# Patient Record
Sex: Female | Born: 1970 | Race: Black or African American | Hispanic: No | Marital: Married | Smoking: Never smoker
Health system: Southern US, Community
[De-identification: ages and names within clinical notes are randomized; demographics above are authoritative.]

## PROBLEM LIST (undated history)

## (undated) DIAGNOSIS — I10 Essential (primary) hypertension: Secondary | ICD-10-CM

## (undated) DIAGNOSIS — F319 Bipolar disorder, unspecified: Secondary | ICD-10-CM

## (undated) DIAGNOSIS — E119 Type 2 diabetes mellitus without complications: Secondary | ICD-10-CM

## (undated) HISTORY — PX: HYSTEROSCOPY: SHX211

## (undated) HISTORY — PX: OTHER SURGICAL HISTORY: SHX169

---

## 2000-06-11 ENCOUNTER — Other Ambulatory Visit: Admission: RE | Admit: 2000-06-11 | Discharge: 2000-06-11 | Payer: Self-pay | Admitting: Internal Medicine

## 2000-06-12 ENCOUNTER — Encounter: Payer: Self-pay | Admitting: Internal Medicine

## 2001-07-29 ENCOUNTER — Emergency Department (HOSPITAL_COMMUNITY): Admission: EM | Admit: 2001-07-29 | Discharge: 2001-07-29 | Payer: Self-pay | Admitting: Emergency Medicine

## 2002-08-07 ENCOUNTER — Emergency Department (HOSPITAL_COMMUNITY): Admission: EM | Admit: 2002-08-07 | Discharge: 2002-08-07 | Payer: Self-pay | Admitting: Emergency Medicine

## 2003-04-07 ENCOUNTER — Encounter (INDEPENDENT_AMBULATORY_CARE_PROVIDER_SITE_OTHER): Payer: Self-pay | Admitting: Specialist

## 2003-04-07 ENCOUNTER — Ambulatory Visit (HOSPITAL_COMMUNITY): Admission: RE | Admit: 2003-04-07 | Discharge: 2003-04-07 | Payer: Self-pay | Admitting: *Deleted

## 2003-06-16 ENCOUNTER — Inpatient Hospital Stay (HOSPITAL_COMMUNITY): Admission: AD | Admit: 2003-06-16 | Discharge: 2003-06-16 | Payer: Self-pay | Admitting: Obstetrics & Gynecology

## 2003-06-19 ENCOUNTER — Inpatient Hospital Stay (HOSPITAL_COMMUNITY): Admission: AD | Admit: 2003-06-19 | Discharge: 2003-06-19 | Payer: Self-pay | Admitting: *Deleted

## 2003-06-20 ENCOUNTER — Inpatient Hospital Stay (HOSPITAL_COMMUNITY): Admission: AD | Admit: 2003-06-20 | Discharge: 2003-06-20 | Payer: Self-pay | Admitting: Obstetrics & Gynecology

## 2003-06-30 ENCOUNTER — Ambulatory Visit (HOSPITAL_COMMUNITY): Admission: RE | Admit: 2003-06-30 | Discharge: 2003-06-30 | Payer: Self-pay | Admitting: Obstetrics and Gynecology

## 2003-07-17 ENCOUNTER — Inpatient Hospital Stay (HOSPITAL_COMMUNITY): Admission: AD | Admit: 2003-07-17 | Discharge: 2003-07-17 | Payer: Self-pay | Admitting: Obstetrics & Gynecology

## 2003-07-22 ENCOUNTER — Encounter (INDEPENDENT_AMBULATORY_CARE_PROVIDER_SITE_OTHER): Payer: Self-pay | Admitting: Specialist

## 2003-07-22 ENCOUNTER — Inpatient Hospital Stay (HOSPITAL_COMMUNITY): Admission: AD | Admit: 2003-07-22 | Discharge: 2003-07-22 | Payer: Self-pay | Admitting: Obstetrics and Gynecology

## 2004-04-17 ENCOUNTER — Ambulatory Visit: Payer: Self-pay | Admitting: Nurse Practitioner

## 2004-04-29 ENCOUNTER — Ambulatory Visit (HOSPITAL_COMMUNITY): Admission: RE | Admit: 2004-04-29 | Discharge: 2004-04-29 | Payer: Self-pay | Admitting: Internal Medicine

## 2004-07-15 ENCOUNTER — Encounter: Admission: RE | Admit: 2004-07-15 | Discharge: 2004-07-15 | Payer: Self-pay | Admitting: Obstetrics and Gynecology

## 2004-07-16 ENCOUNTER — Encounter (INDEPENDENT_AMBULATORY_CARE_PROVIDER_SITE_OTHER): Payer: Self-pay | Admitting: *Deleted

## 2004-07-16 ENCOUNTER — Ambulatory Visit (HOSPITAL_COMMUNITY): Admission: RE | Admit: 2004-07-16 | Discharge: 2004-07-16 | Payer: Self-pay | Admitting: Obstetrics and Gynecology

## 2004-07-18 ENCOUNTER — Inpatient Hospital Stay (HOSPITAL_COMMUNITY): Admission: AD | Admit: 2004-07-18 | Discharge: 2004-07-18 | Payer: Self-pay | Admitting: Obstetrics and Gynecology

## 2004-10-05 IMAGING — US US OB TRANSVAGINAL
1 series · 18 of 18 positions shown · non-contrast
Comparison: none

CLINICAL DATA: Early pregnancy.
TRANSVAGINAL OBSTETRICAL ULTRASOUND:
Comparing 06/30/03.
We demonstrate an intrauterine sac with mean sac diameter of 1.9 cm consistent with a 6 week 6 day pregnancy.  However, the patient?s gestational age by her first ultrasound should be 8 weeks 1 day and whereas on that prior study of 06/30/03 we demonstrated a normal appearing yolk sac, currently we demonstrate only an irregular appearing membrane and no definite fetal pole.  No fetal cardiac activity is identified.  The uterus is retroverted.  Neither ovary could be well-seen.  No free pelvic fluid is identified.
IMPRESSION
Failure of expected development of embryonic pole.  The appearance is most consistent with anembryonic pregnancy.

[Series 1: us ob transvaginal · 18 of 18 slices shown]
[im 1/18]
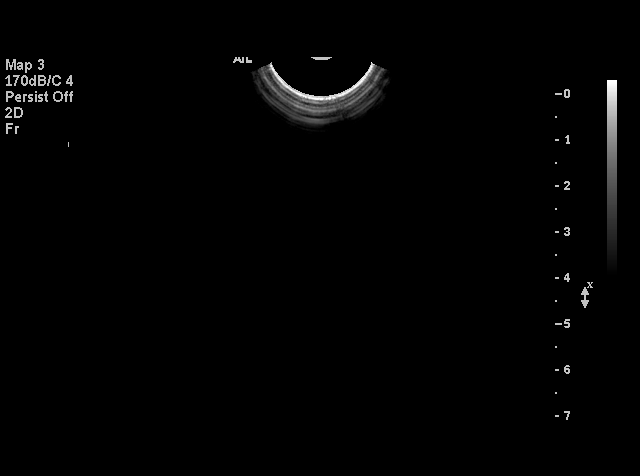
[im 2/18]
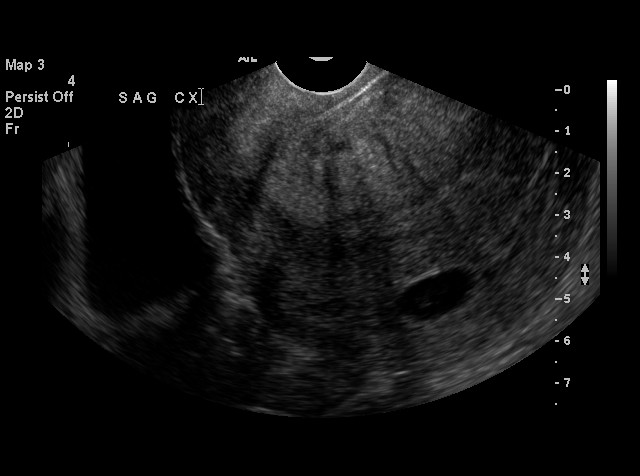
[im 3/18]
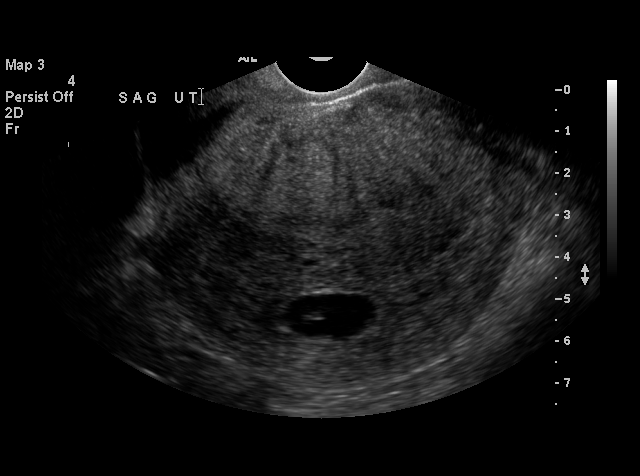
[im 4/18]
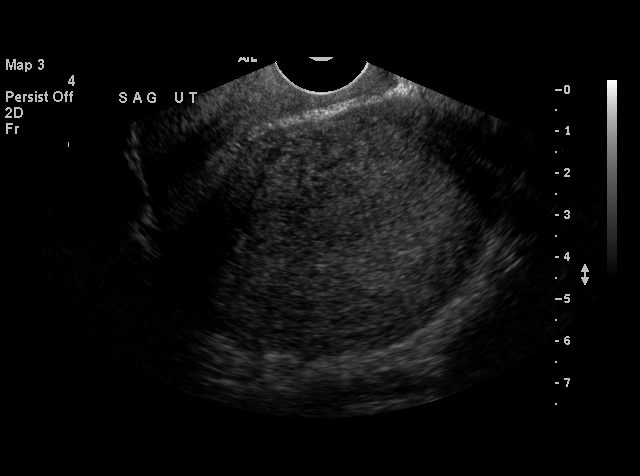
[im 5/18]
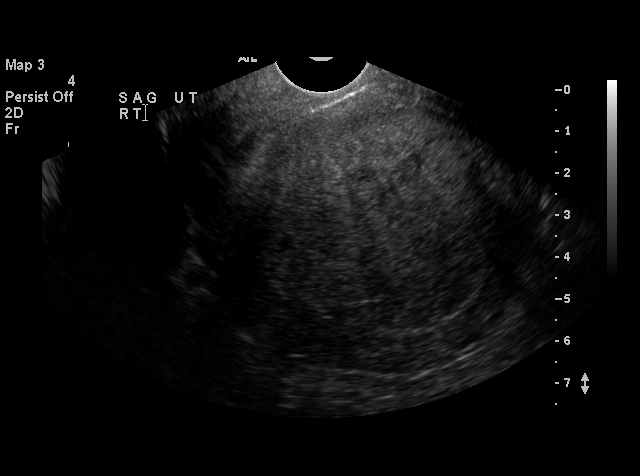
[im 6/18]
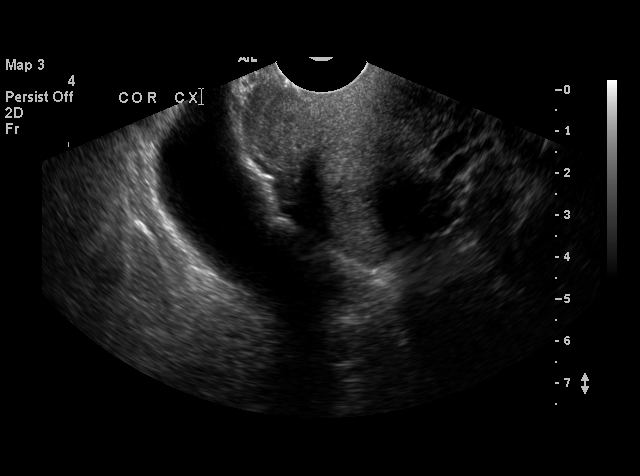
[im 7/18]
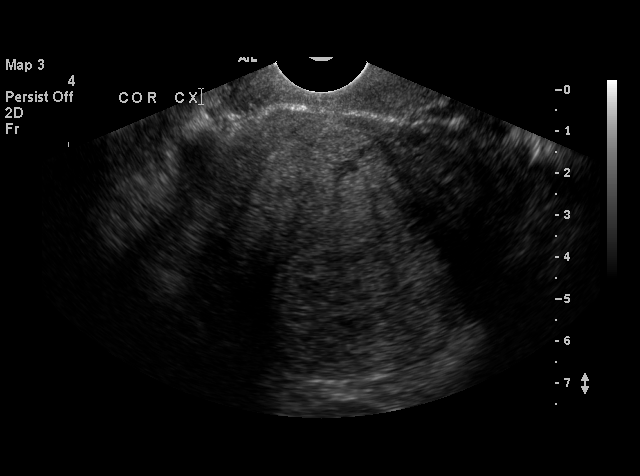
[im 8/18]
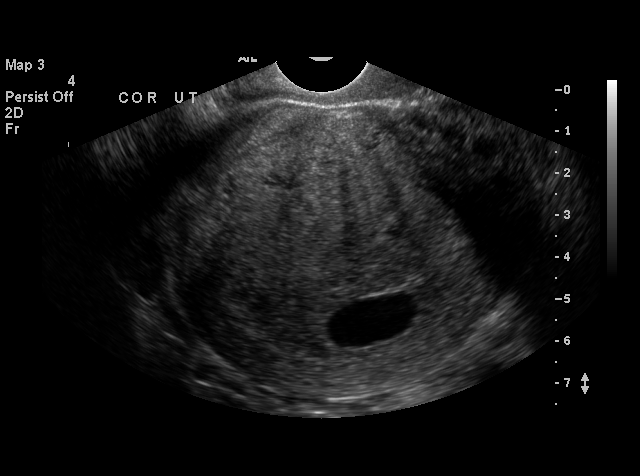
[im 9/18]
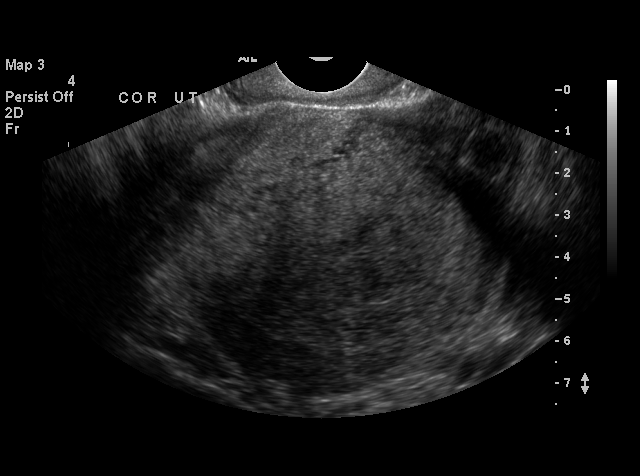
[im 10/18]
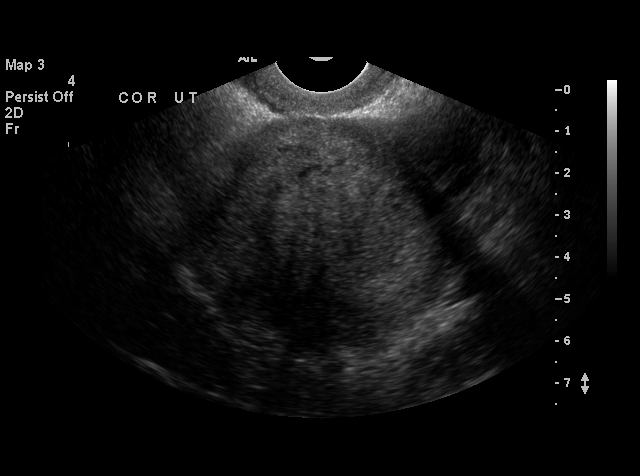
[im 11/18]
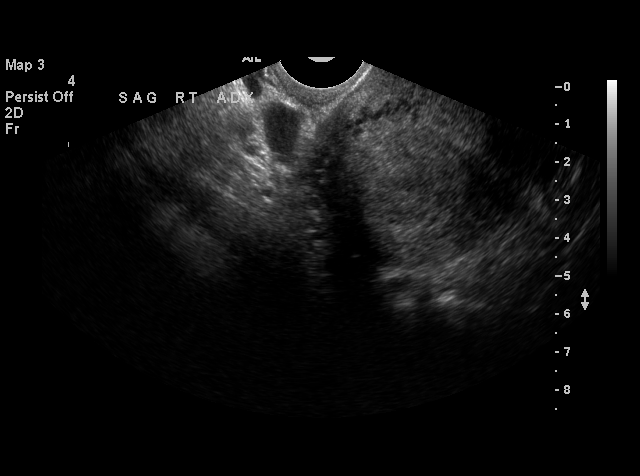
[im 12/18]
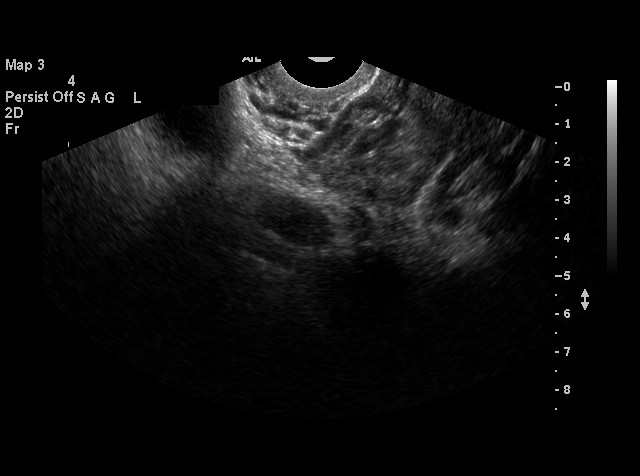
[im 13/18]
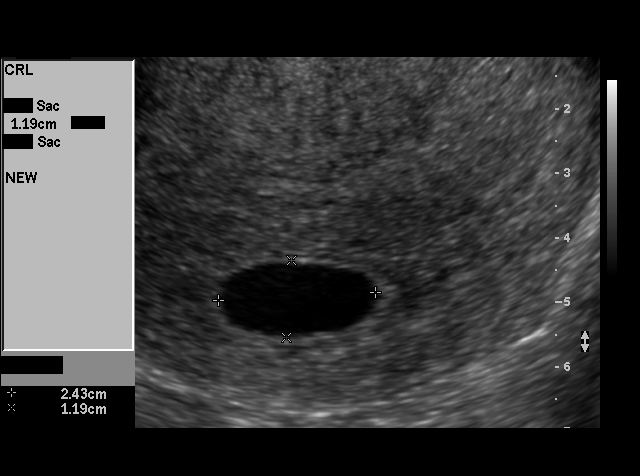
[im 14/18]
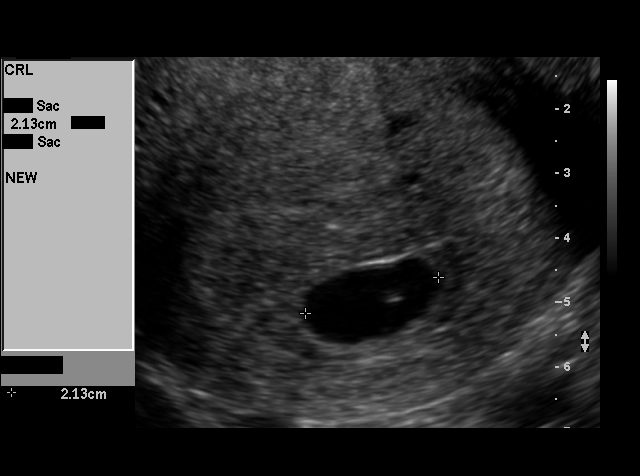
[im 15/18]
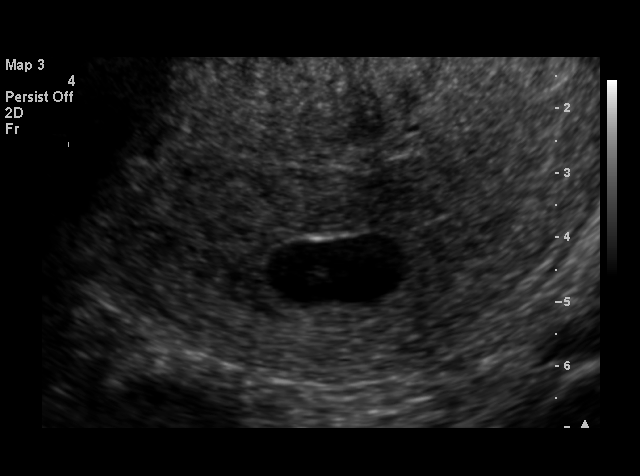
[im 16/18]
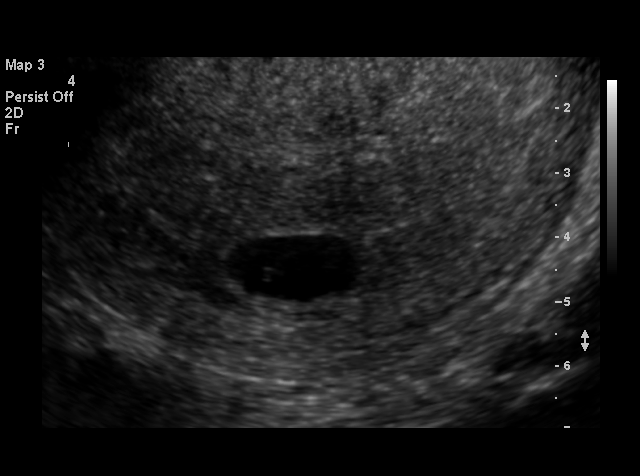
[im 17/18]
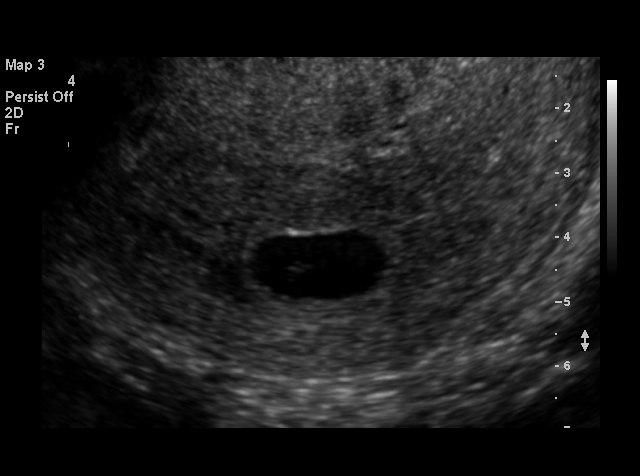
[im 18/18]
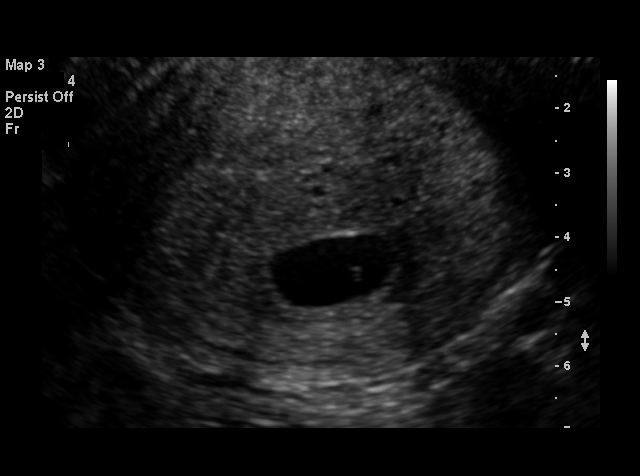

[18 of 18 positions shown; findings below may reference images not displayed]

## 2005-03-31 ENCOUNTER — Ambulatory Visit (HOSPITAL_COMMUNITY): Admission: RE | Admit: 2005-03-31 | Discharge: 2005-03-31 | Payer: Self-pay | Admitting: Obstetrics & Gynecology

## 2005-05-03 ENCOUNTER — Inpatient Hospital Stay (HOSPITAL_COMMUNITY): Admission: AD | Admit: 2005-05-03 | Discharge: 2005-05-03 | Payer: Self-pay | Admitting: Obstetrics

## 2005-12-16 ENCOUNTER — Ambulatory Visit (HOSPITAL_COMMUNITY): Admission: RE | Admit: 2005-12-16 | Discharge: 2005-12-16 | Payer: Self-pay | Admitting: Obstetrics & Gynecology

## 2006-01-16 ENCOUNTER — Ambulatory Visit (HOSPITAL_COMMUNITY): Admission: RE | Admit: 2006-01-16 | Discharge: 2006-01-16 | Payer: Self-pay | Admitting: Obstetrics & Gynecology

## 2006-03-18 ENCOUNTER — Inpatient Hospital Stay (HOSPITAL_COMMUNITY): Admission: AD | Admit: 2006-03-18 | Discharge: 2006-03-18 | Payer: Self-pay | Admitting: Obstetrics

## 2006-04-01 ENCOUNTER — Inpatient Hospital Stay (HOSPITAL_COMMUNITY): Admission: AD | Admit: 2006-04-01 | Discharge: 2006-04-01 | Payer: Self-pay | Admitting: Obstetrics

## 2008-04-13 ENCOUNTER — Emergency Department (HOSPITAL_COMMUNITY): Admission: AD | Admit: 2008-04-13 | Discharge: 2008-04-13 | Payer: Self-pay | Admitting: Family Medicine

## 2008-05-09 ENCOUNTER — Inpatient Hospital Stay (HOSPITAL_COMMUNITY): Admission: AD | Admit: 2008-05-09 | Discharge: 2008-05-09 | Payer: Self-pay | Admitting: Obstetrics

## 2008-08-16 ENCOUNTER — Ambulatory Visit (HOSPITAL_COMMUNITY): Admission: RE | Admit: 2008-08-16 | Discharge: 2008-08-16 | Payer: Self-pay | Admitting: Obstetrics & Gynecology

## 2008-08-18 ENCOUNTER — Observation Stay: Payer: Self-pay

## 2008-08-25 ENCOUNTER — Ambulatory Visit (HOSPITAL_COMMUNITY): Admission: RE | Admit: 2008-08-25 | Discharge: 2008-08-25 | Payer: Self-pay | Admitting: Obstetrics & Gynecology

## 2008-09-21 ENCOUNTER — Encounter: Admission: RE | Admit: 2008-09-21 | Discharge: 2008-10-03 | Payer: Self-pay | Admitting: Obstetrics & Gynecology

## 2008-09-29 ENCOUNTER — Ambulatory Visit (HOSPITAL_COMMUNITY): Admission: RE | Admit: 2008-09-29 | Discharge: 2008-09-29 | Payer: Self-pay | Admitting: Obstetrics & Gynecology

## 2008-10-20 ENCOUNTER — Observation Stay: Payer: Self-pay | Admitting: Obstetrics and Gynecology

## 2008-10-24 ENCOUNTER — Encounter: Admission: RE | Admit: 2008-10-24 | Discharge: 2009-01-03 | Payer: Self-pay | Admitting: Obstetrics & Gynecology

## 2008-10-27 ENCOUNTER — Ambulatory Visit (HOSPITAL_COMMUNITY): Admission: RE | Admit: 2008-10-27 | Discharge: 2008-10-27 | Payer: Self-pay | Admitting: Obstetrics & Gynecology

## 2008-11-15 ENCOUNTER — Ambulatory Visit (HOSPITAL_COMMUNITY): Admission: RE | Admit: 2008-11-15 | Discharge: 2008-11-15 | Payer: Self-pay | Admitting: Obstetrics & Gynecology

## 2008-11-25 ENCOUNTER — Inpatient Hospital Stay (HOSPITAL_COMMUNITY): Admission: AD | Admit: 2008-11-25 | Discharge: 2008-11-26 | Payer: Self-pay | Admitting: Obstetrics & Gynecology

## 2008-12-06 ENCOUNTER — Ambulatory Visit (HOSPITAL_COMMUNITY): Admission: RE | Admit: 2008-12-06 | Discharge: 2008-12-06 | Payer: Self-pay | Admitting: Obstetrics & Gynecology

## 2008-12-14 ENCOUNTER — Observation Stay: Payer: Self-pay | Admitting: Obstetrics and Gynecology

## 2008-12-18 ENCOUNTER — Inpatient Hospital Stay (HOSPITAL_COMMUNITY): Admission: AD | Admit: 2008-12-18 | Discharge: 2008-12-18 | Payer: Self-pay | Admitting: Obstetrics

## 2008-12-20 ENCOUNTER — Inpatient Hospital Stay (HOSPITAL_COMMUNITY): Admission: AD | Admit: 2008-12-20 | Discharge: 2008-12-20 | Payer: Self-pay | Admitting: Obstetrics & Gynecology

## 2008-12-22 ENCOUNTER — Inpatient Hospital Stay (HOSPITAL_COMMUNITY): Admission: AD | Admit: 2008-12-22 | Discharge: 2008-12-25 | Payer: Self-pay | Admitting: Obstetrics

## 2008-12-25 ENCOUNTER — Encounter: Payer: Self-pay | Admitting: Obstetrics & Gynecology

## 2009-01-02 ENCOUNTER — Ambulatory Visit (HOSPITAL_COMMUNITY): Admission: RE | Admit: 2009-01-02 | Discharge: 2009-01-02 | Payer: Self-pay | Admitting: Obstetrics & Gynecology

## 2009-01-08 ENCOUNTER — Inpatient Hospital Stay (HOSPITAL_COMMUNITY): Admission: AD | Admit: 2009-01-08 | Discharge: 2009-01-11 | Payer: Self-pay | Admitting: Obstetrics

## 2010-01-27 ENCOUNTER — Encounter: Payer: Self-pay | Admitting: Obstetrics

## 2010-01-27 ENCOUNTER — Encounter: Payer: Self-pay | Admitting: Obstetrics & Gynecology

## 2010-01-27 ENCOUNTER — Encounter: Payer: Self-pay | Admitting: Obstetrics and Gynecology

## 2010-01-28 ENCOUNTER — Encounter: Payer: Self-pay | Admitting: Obstetrics & Gynecology

## 2010-01-31 ENCOUNTER — Other Ambulatory Visit: Payer: Self-pay | Admitting: Internal Medicine

## 2010-01-31 DIAGNOSIS — Z1231 Encounter for screening mammogram for malignant neoplasm of breast: Secondary | ICD-10-CM

## 2010-01-31 DIAGNOSIS — Z1239 Encounter for other screening for malignant neoplasm of breast: Secondary | ICD-10-CM

## 2010-02-15 ENCOUNTER — Ambulatory Visit: Payer: Self-pay

## 2010-03-24 LAB — COMPREHENSIVE METABOLIC PANEL
ALT: 18 U/L (ref 0–35)
Alkaline Phosphatase: 106 U/L (ref 39–117)
BUN: 5 mg/dL — ABNORMAL LOW (ref 6–23)
CO2: 22 mEq/L (ref 19–32)
Chloride: 106 mEq/L (ref 96–112)
GFR calc non Af Amer: >60 mL/min (ref >60)
Glucose, Bld: 89 mg/dL (ref 70–99)
Potassium: 3.7 mEq/L (ref 3.5–5.1)
Sodium: 133 mEq/L — ABNORMAL LOW (ref 135–145)
Total Bilirubin: 0.6 mg/dL (ref 0.3–1.2)
Total Protein: 5.7 g/dL — ABNORMAL LOW (ref 6.0–8.3)

## 2010-03-24 LAB — URIC ACID: Uric Acid, Serum: 3.9 mg/dL (ref 2.4–7.0)

## 2010-03-24 LAB — RPR: RPR Ser Ql: NONREACTIVE

## 2010-03-24 LAB — CBC
HCT: 35.3 % — ABNORMAL LOW (ref 36.0–46.0)
HCT: 39.6 % (ref 36.0–46.0)
Hemoglobin: 13.3 g/dL (ref 12.0–15.0)
Platelets: 169 10*3/uL (ref 150–400)
RBC: 4.45 MIL/uL (ref 3.87–5.11)
RDW: 14 % (ref 11.5–15.5)
RDW: 14.1 % (ref 11.5–15.5)

## 2010-03-24 LAB — GLUCOSE, CAPILLARY
Glucose-Capillary: 103 mg/dL — ABNORMAL HIGH (ref 70–99)
Glucose-Capillary: 120 mg/dL — ABNORMAL HIGH (ref 70–99)
Glucose-Capillary: 66 mg/dL — ABNORMAL LOW (ref 70–99)
Glucose-Capillary: 88 mg/dL (ref 70–99)

## 2010-03-24 LAB — LACTATE DEHYDROGENASE: LDH: 134 U/L (ref 94–250)

## 2010-03-24 IMAGING — US US OB LIMITED
1 series · 18 of 21 positions shown · non-contrast
Comparison: none

OBSTETRICAL ULTRASOUND:
 This ultrasound was performed in The [HOSPITAL], and the [facility] report will be stored to [REDACTED] PACS.  This report is also available in [HOSPITAL]?s accessANYware.

[Series 1: us ob limited · 21 acquisitions, 18 frames shown]
[im 1/21]
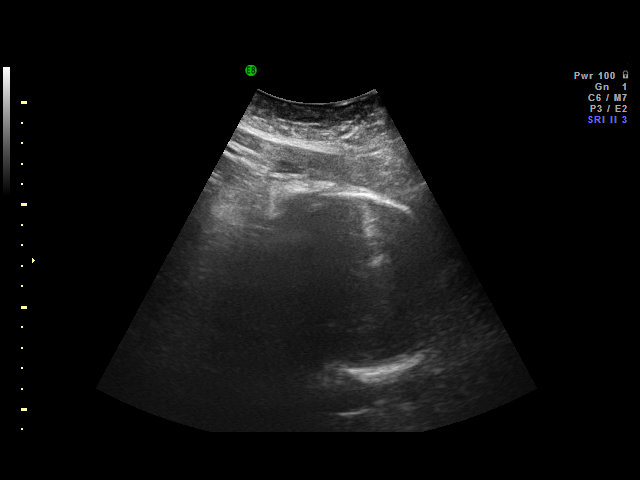
[im 2/21]
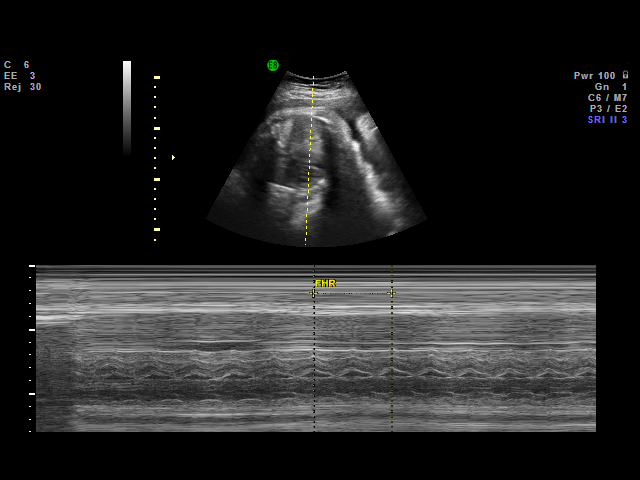
[im 3/21]
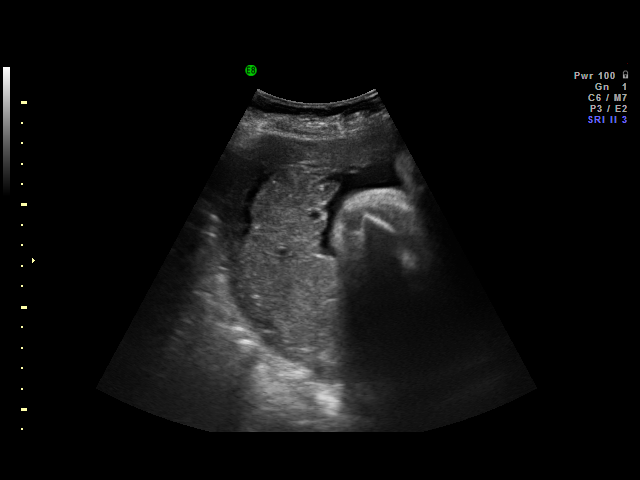
[im 5/21]
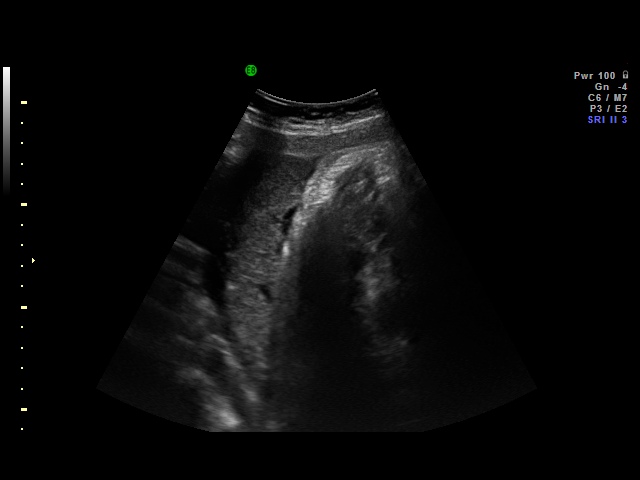
[im 6/21]
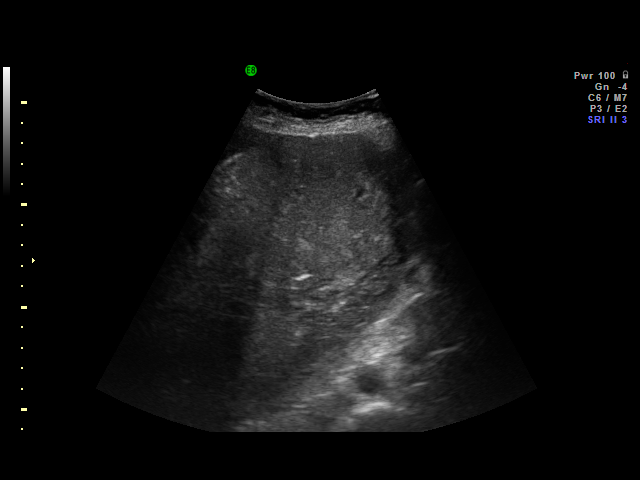
[im 7/21]
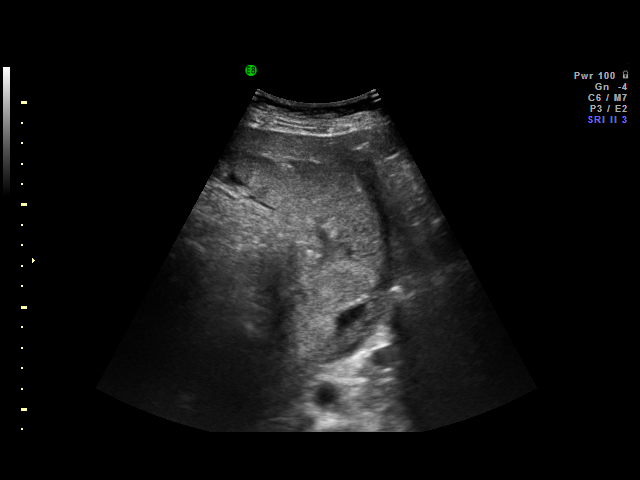
[im 8/21]
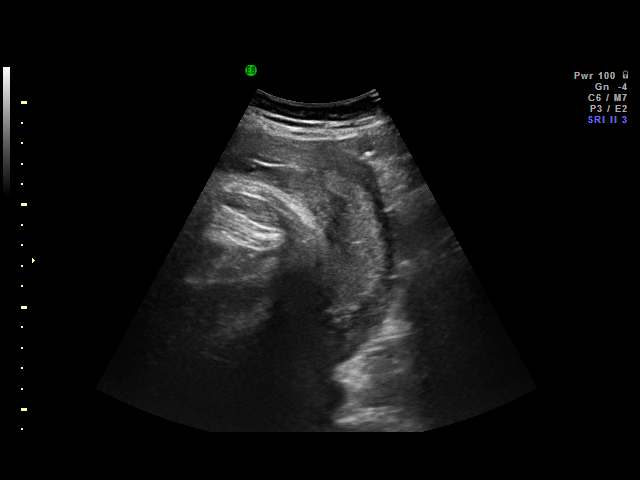
[im 9/21]
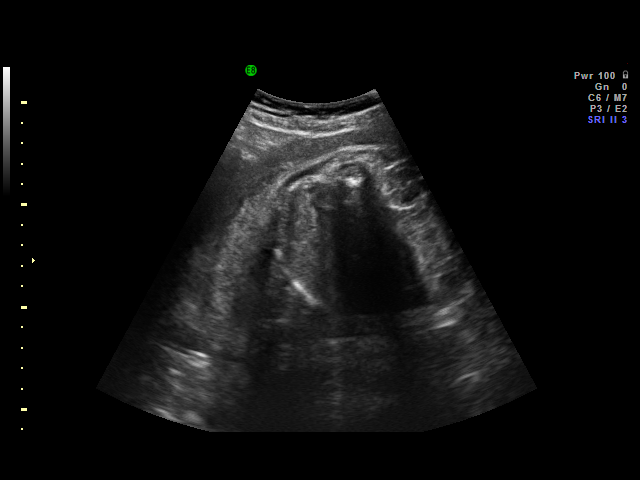
[im 10/21]
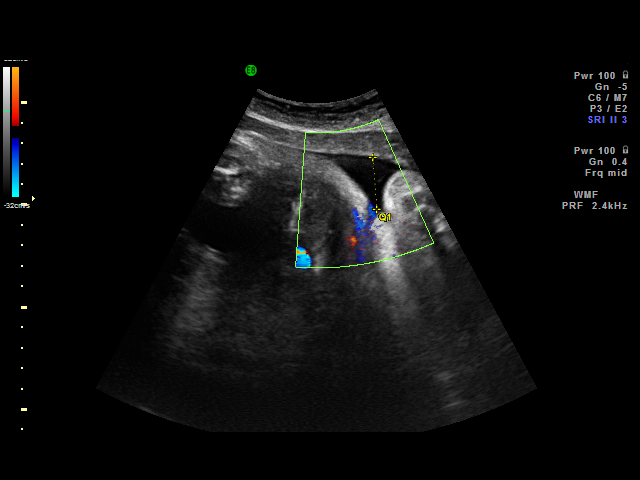
[im 12/21]
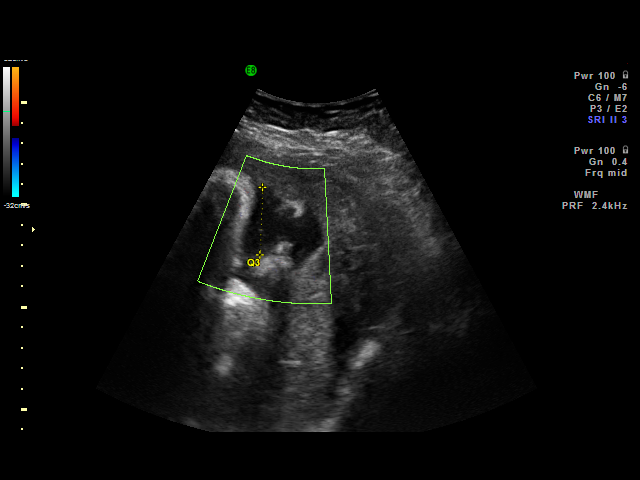
[im 13/21]
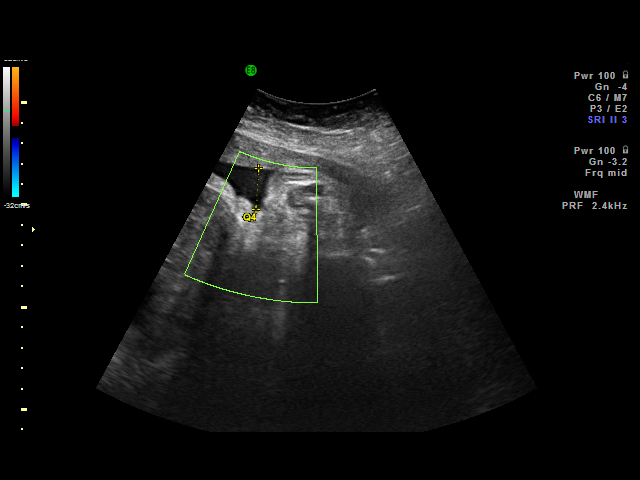
[im 14/21]
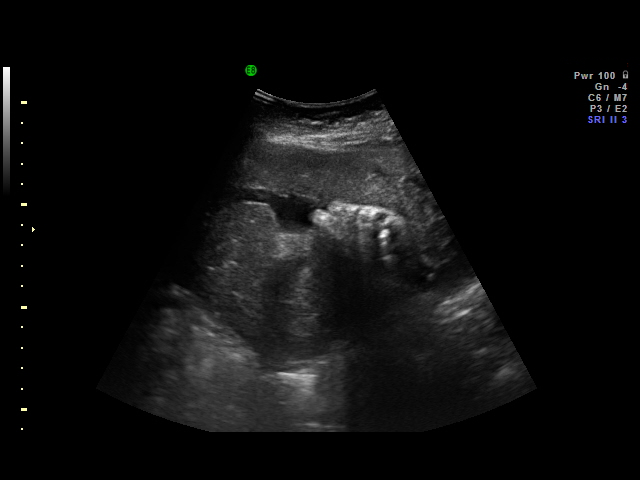
[im 15/21]
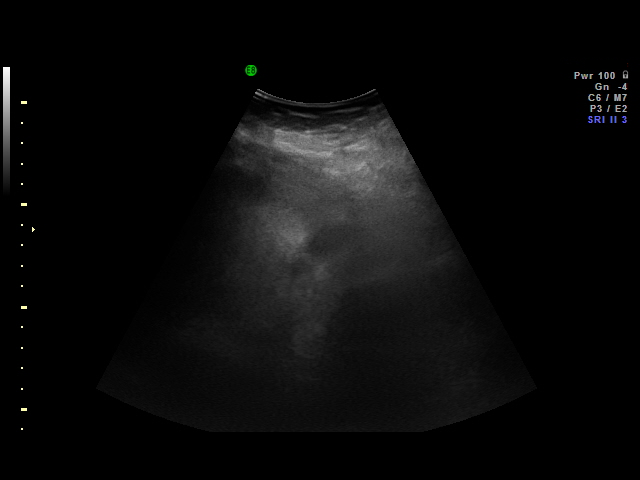
[im 16/21]
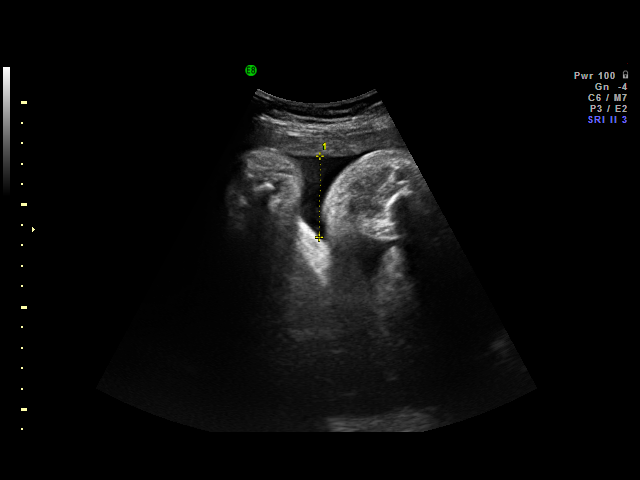
[im 17/21]
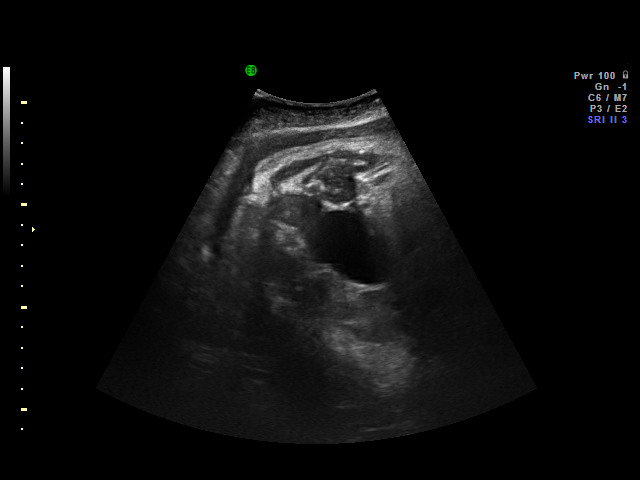
[im 19/21]
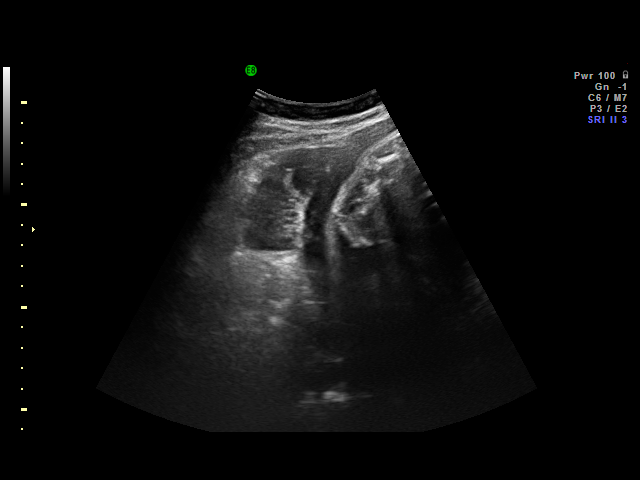
[im 20/21]
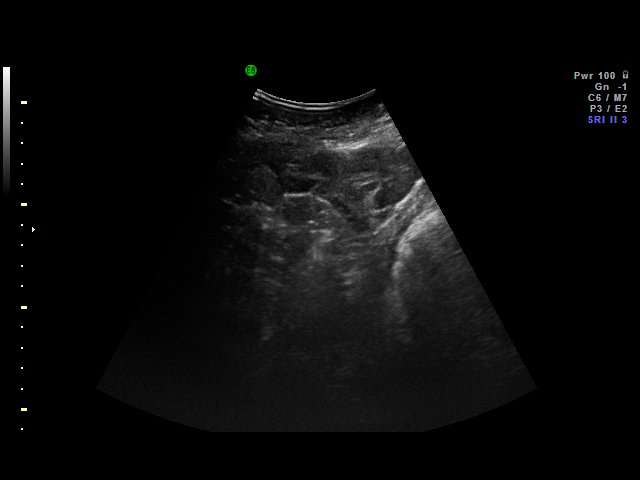
[im 21/21]
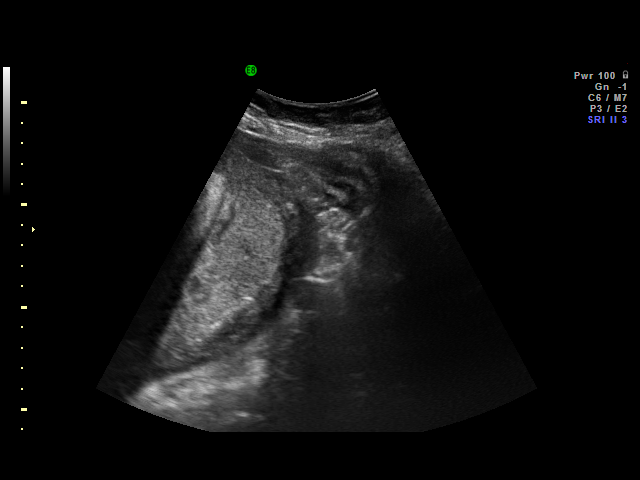

[18 of 21 positions shown; findings below may reference images not displayed]

IMPRESSION: [facility] has also been faxed to the ordering physician.

## 2010-04-08 LAB — CBC
HCT: 39 % (ref 36.0–46.0)
MCV: 88.1 fL (ref 78.0–100.0)
Platelets: 264 10*3/uL (ref 150–400)
RDW: 14.3 % (ref 11.5–15.5)

## 2010-04-08 LAB — URINALYSIS, DIPSTICK ONLY
Bilirubin Urine: NEGATIVE
Glucose, UA: NEGATIVE mg/dL
Hgb urine dipstick: NEGATIVE
Ketones, ur: NEGATIVE mg/dL
pH: 6.5 (ref 5.0–8.0)

## 2010-04-08 LAB — COMPREHENSIVE METABOLIC PANEL
Albumin: 2.4 g/dL — ABNORMAL LOW (ref 3.5–5.2)
BUN: 6 mg/dL (ref 6–23)
Creatinine, Ser: 0.85 mg/dL (ref 0.4–1.2)
Potassium: 3.8 mEq/L (ref 3.5–5.1)
Total Protein: 6 g/dL (ref 6.0–8.3)

## 2010-04-08 LAB — GLUCOSE, CAPILLARY
Glucose-Capillary: 106 mg/dL — ABNORMAL HIGH (ref 70–99)
Glucose-Capillary: 108 mg/dL — ABNORMAL HIGH (ref 70–99)
Glucose-Capillary: 122 mg/dL — ABNORMAL HIGH (ref 70–99)
Glucose-Capillary: 137 mg/dL — ABNORMAL HIGH (ref 70–99)
Glucose-Capillary: 78 mg/dL (ref 70–99)
Glucose-Capillary: 88 mg/dL (ref 70–99)
Glucose-Capillary: 95 mg/dL (ref 70–99)

## 2010-04-08 LAB — URINE CULTURE: Colony Count: 25000

## 2010-04-09 LAB — CBC
HCT: 39.8 % (ref 36.0–46.0)
Hemoglobin: 13.2 g/dL (ref 12.0–15.0)
Platelets: 257 10*3/uL (ref 150–400)
Platelets: 261 10*3/uL (ref 150–400)
RDW: 14.3 % (ref 11.5–15.5)
WBC: 8 10*3/uL (ref 4.0–10.5)
WBC: 8.2 10*3/uL (ref 4.0–10.5)

## 2010-04-09 LAB — URINALYSIS, ROUTINE W REFLEX MICROSCOPIC
Bilirubin Urine: NEGATIVE
Glucose, UA: >1000 mg/dL — AB
Ketones, ur: NEGATIVE mg/dL
Specific Gravity, Urine: 1.015 (ref 1.005–1.030)
pH: 5.5 (ref 5.0–8.0)

## 2010-04-09 LAB — COMPREHENSIVE METABOLIC PANEL
ALT: 19 U/L (ref 0–35)
AST: 21 U/L (ref 0–37)
Albumin: 2.4 g/dL — ABNORMAL LOW (ref 3.5–5.2)
Albumin: 2.4 g/dL — ABNORMAL LOW (ref 3.5–5.2)
Alkaline Phosphatase: 100 U/L (ref 39–117)
BUN: 3 mg/dL — ABNORMAL LOW (ref 6–23)
Chloride: 102 mEq/L (ref 96–112)
Chloride: 106 mEq/L (ref 96–112)
Creatinine, Ser: 0.72 mg/dL (ref 0.4–1.2)
GFR calc Af Amer: >60 mL/min (ref >60)
GFR calc non Af Amer: >60 mL/min (ref >60)
Potassium: 3.6 mEq/L (ref 3.5–5.1)
Sodium: 131 mEq/L — ABNORMAL LOW (ref 135–145)
Total Bilirubin: 0.5 mg/dL (ref 0.3–1.2)
Total Bilirubin: 0.7 mg/dL (ref 0.3–1.2)

## 2010-04-09 LAB — LACTATE DEHYDROGENASE: LDH: 111 U/L (ref 94–250)

## 2010-04-09 LAB — URINE MICROSCOPIC-ADD ON

## 2010-04-09 LAB — URIC ACID: Uric Acid, Serum: 3.7 mg/dL (ref 2.4–7.0)

## 2010-04-10 LAB — URINALYSIS, ROUTINE W REFLEX MICROSCOPIC
Glucose, UA: 100 mg/dL — AB
Nitrite: NEGATIVE
Protein, ur: 100 mg/dL — AB
pH: 5.5 (ref 5.0–8.0)

## 2010-04-10 LAB — URINE MICROSCOPIC-ADD ON

## 2010-05-24 NOTE — H&P (Signed)
NAME:  Emily, Navarro                       ACCOUNT NO.:  [id number]   MEDICAL RECORD NO.:  [id number]                   PATIENT TYPE:  AMB   LOCATION:  [facility]C                                  FACILITY:  [facility]   PHYSICIAN:  [place] B. Earlene Plater, M.D.               DATE OF BIRTH:  June 15, 1970   DATE OF ADMISSION:  DATE OF DISCHARGE:                                HISTORY & PHYSICAL   DATE OF PROCEDURE:  April 07, 2003   PREOPERATIVE DIAGNOSIS:  Abnormal bleeding and markedly-thickened  endometrium with unsatisfactory sonohysterogram preoperatively.   HISTORY OF PRESENT ILLNESS:  A 40 year old African-American female gravida 5  para 2-0-3-2 with a history of irregular and heavy bleeding.  Has been  controlled with Lo/Ovral taper but this is causing severe nausea.  Ultrasound in the office showed a very thickened endometrium (2.6 cm) with a  focal area suggestive of a polyp.  Saline infusion ultrasound was  subsequently performed but adequate uterine distention could not be obtained  to further delineate the nature of the lesion.  The patient therefore  presents for hysteroscopy for surgical diagnosis and potential treatment.   PAST MEDICAL HISTORY:  None.   PAST OBSTETRICAL HISTORY:  SVD x2, TAB x2, SAB x1.   SURGICAL HISTORY:  D&C x2.   MEDICATIONS:  None.   ALLERGIES:  None.   SOCIAL HISTORY:  Occasional alcohol, no tobacco or other drugs.   FAMILY HISTORY:  Noncontributory.   REVIEW OF SYMPTOMS:  Otherwise noncontributory.   PHYSICAL EXAMINATION:  VITAL SIGNS:  Blood pressure 124/84, temperature  98.4, weight 198.  GENERAL:  Alert and oriented, no acute distress.  SKIN:  Warm and dry, no lesions.  HEART:  Regular rate and rhythm.  LUNGS:  Clear to auscultation.  ABDOMEN:  Obese, no masses palpable.  PELVIC:  Normal external genitalia, vagina and cervix normal.  Uterus normal  size, retroverted, nontender.   ASSESSMENT:  Abnormal bleeding, very thickened endometrium on  ultrasound  with unsatisfactory saline infusion ultrasound.  Discussed with the patient  potential diagnoses such as endometrial hyperplasia, proliferation, or  endometrial polyps as potential causes of her bleeding.  Operative risks  discussed including infection, bleeding, uterine perforation, damage to  surrounding organs, or fluid overload.  All questions answered.  The patient  wishes to proceed.                                               Gerri Spore B. Earlene Plater, M.D.    WBD/MEDQ  D:  04/06/2003  T:  04/06/2003  Job:  [id number]

## 2010-05-24 NOTE — Op Note (Signed)
Emily Navarro, Emily Navarro             ACCOUNT NO.:  [id number]   MEDICAL RECORD NO.:  [id number]          PATIENT TYPE:  AMB   LOCATION:  SDC                           [facility]ACILITY:  [facility]   PHYSICIAN:  Maxie Better, M.D.DATE OF BIRTH:  January 20, 1970   DATE OF PROCEDURE:  07/16/2004  DATE OF DISCHARGE:                                 OPERATIVE REPORT   PREOPERATIVE DIAGNOSIS:  Abnormal uterine bleeding, endometrial mass.   PROCEDURE:  Diagnostic hysteroscopy, resection of endometrial polyps,  dilatation and curettage.   POSTOPERATIVE DIAGNOSIS:  Abnormal uterine bleeding, endometrial polyp.   ANESTHESIA:  General paracervical block.   SURGEON:  Maxie Better, M.D.   DESCRIPTION OF PROCEDURE:  Under adequate general anesthesia, the patient  was placed in the dorsal lithotomy.  She was sterilely prepped and draped in  the usual fashion.  Bladder was catheterized with a small amount of urine.  Examination under anesthesia revealed a retroverted, bulky uterus, and  parous cervix.  A bivalve speculum was placed in the vagina.  Single tooth  tenaculum was placed on the anterior lip of the cervix.  10 mL of 1%  Nesacaine was injected paracervically at 3 and 9 o'clock.  The cervix was  then serially dilated up to a #31 Pratt dilator.  A resectoscope was  introduced into the uterine cavity.  Copious amount of polypoid tissues were  noted, particularly polypoid mass on the posterior aspect of the endometrial  cavity.  This limited the ability to see the tubal ostia.  The resectoscope  was therefore removed.  The cavity was then curetted for a large amount of  tissue.  The resectoscope was then once again reinserted into the  endometrial cavity.  Other polypoid lesions were then carefully resected.  When the cavity was felt to have been without any polypoid tissue, the  resectoscope was removed.  The endocervical canal had no lesions.  The tubal  ostia were not seen.  The cavity was then  gently curetted for additional  tissue and then all instruments were then removed from the vagina.  Specimen  labled endometrial curetting and endometrial polyps sent to pathology.  Estimated blood loss was minimal.  Fluid deficit was 100 mL.  Complications  were none.  The patient tolerated the procedure well and was transferred to  the recovery room in stable condition.       [place]/MEDQ  D:  07/16/2004  T:  07/16/2004  Job:  [id number]

## 2010-05-24 NOTE — Op Note (Signed)
NAME:  Emily Navarro, Emily Navarro             ACCOUNT NO.:  [id number]   MEDICAL RECORD NO.:  [id number]          PATIENT TYPE:  AMB   LOCATION:  [facility]C                           [facility]ACILITY:  [facility]   PHYSICIAN:  Roseanna Rainbow, M.D.DATE OF BIRTH:  07/13/1970   DATE OF PROCEDURE:  01/16/2006  DATE OF DISCHARGE:                               OPERATIVE REPORT   PREOPERATIVE DIAGNOSIS:  Rule out imbedded intrauterine device.   POSTOPERATIVE DIAGNOSIS:  Rule out imbedded intrauterine device.   PROCEDURE:  Operative hysteroscopy with removal of intrauterine device.   SURGEON:  Jackson-Moore.   ANESTHESIA:  Laryngeal mask airway.   ESTIMATED BLOOD LOSS:  Minimal.   COMPLICATIONS:  None.   PATHOLOGY:  None.   PROCEDURE:  The patient was taken to the operating room with an IV  running.  The laryngeal mask airway was placed without difficulty.  She  was placed in the dorsal lithotomy position and prepped and draped in  the usual sterile fashion.  A weighted speculum was placed in the  patient's vagina.  The anterior lip of the cervix was grasped with a  single-tooth tenaculum.  The cervix was then dilated with [facility]  dilators.  The hysteroscope was then introduced into the cervix and  advanced to the lower uterine segment where the IUD was noted.  This was  grasped under direct visualization with a grasper and removed intact.  A  hysteroscopic survey of the intrauterine cavity was normal.  The  hysteroscope was removed.  The single-toothed tenaculum was removed from  the cervix with minimal bleeding noted.  At the close of the procedure,  the instrument and pack counts were said to be correct x2.  The patient  was taken to the PACU awake and in stable condition.      Roseanna Rainbow, M.D.  Electronically Signed     LAJ/MEDQ  D:  01/16/2006  T:  01/16/2006  Job:  [id number]

## 2010-05-24 NOTE — Op Note (Signed)
NAME:  Emily Navarro, Emily Navarro                       ACCOUNT NO.:  [id number]   MEDICAL RECORD NO.:  [id number]                   PATIENT TYPE:  AMB   LOCATION:  [facility]C                                  [facility]ILITY:  [facility]   PHYSICIAN:  [place] B. Earlene Plater, M.D.               DATE OF BIRTH:  1970/01/10   DATE OF PROCEDURE:  04/07/2003  DATE OF DISCHARGE:                                 OPERATIVE REPORT   PREOPERATIVE DIAGNOSES:  1. Abnormal bleeding.  2. Thickened endometrium.   POSTOPERATIVE DIAGNOSES:  1. Abnormal bleeding.  2. Thickened endometrium.   PROCEDURE:  Hysteroscopy, dilatation and curettage.   SURGEON:  Chester Holstein. Earlene Plater, M.D.   ANESTHESIA:  LMA general.   FINDINGS:  Shaggy, thickened endometrium.  No polyps or masses.   SPECIMENS:  Endometrial curettings.   ESTIMATED BLOOD LOSS:  50 mL.   DEFICIT:  60 mL sorbitol.   COMPLICATIONS:  None.   INDICATIONS:  Patient with a history of heavy and irregular bleeding.  Pelvic ultrasound showed a markedly thickened endometrium (greater than 2  cm).  Saline infusion ultrasound was attempted in the office but could not  obtain adequate uterine distention to further visualize the cavity;  therefore, the patient presents for hysteroscopy.   The patient was taken to the operating room and LMA anesthesia obtained.  She was placed in the [place] stirrups and prepped and draped in the standard  fashion, a Foley catheter used to empty the bladder in an in-and-out  fashion.  The speculum inserted, a paracervical block placed in standard  fashion with 20 mL of 1% Nesacaine.  Anterior lip of the cervix was grasped  with a single-tooth tenaculum and the cervix was found to be patulous,  easily allowing passage of the diagnostic hysteroscope.  After being flushed  with sorbitol attempts were made to distend the uterus, but due to the  patulous cervix, adequate distention could not be obtained.  Therefore, the  diagnostic scope was removed and  switched to the resectoscope.  The cervix  also allowed easy passage of the resectoscope and by doubly grasping the  cervix on the anterior and posterior lip with a single-tooth tenaculum,  adequate distention was obtained.   The cavity was inspected and the above findings noted.  The endometrium was  then curetted with a serrated curette and copious tissue returned.  This was  continued until a gritty texture was noted throughout.  The scope was  reintroduced and no focal abnormalities were seen.  Therefore, the procedure  was terminated.   The scope was removed and the single-tooth removed.  The cervix was  hemostatic.   The patient tolerated the procedure well and there were no complications.  She was taken to the recovery room awake, alert, and in stable condition.  Gerri Spore B. Earlene Plater, M.D.    WBD/MEDQ  D:  04/07/2003  T:  04/07/2003  Job:  [id number]

## 2010-07-15 ENCOUNTER — Other Ambulatory Visit: Payer: Self-pay | Admitting: Obstetrics

## 2010-07-15 DIAGNOSIS — Z1231 Encounter for screening mammogram for malignant neoplasm of breast: Secondary | ICD-10-CM

## 2010-07-24 ENCOUNTER — Ambulatory Visit (HOSPITAL_COMMUNITY): Payer: Medicaid Other

## 2010-07-25 ENCOUNTER — Other Ambulatory Visit: Payer: Self-pay | Admitting: Obstetrics

## 2010-07-26 ENCOUNTER — Ambulatory Visit (HOSPITAL_COMMUNITY): Payer: Medicaid Other

## 2010-07-29 ENCOUNTER — Ambulatory Visit (HOSPITAL_COMMUNITY)
Admission: RE | Admit: 2010-07-29 | Discharge: 2010-07-29 | Disposition: A | Discharge status: Home / Self Care | Payer: Medicaid Other | Source: Ambulatory Visit | Attending: Obstetrics | Admitting: Obstetrics

## 2010-07-29 DIAGNOSIS — Z1231 Encounter for screening mammogram for malignant neoplasm of breast: Secondary | ICD-10-CM

## 2010-09-25 ENCOUNTER — Other Ambulatory Visit: Payer: Self-pay | Admitting: Obstetrics

## 2010-11-27 ENCOUNTER — Other Ambulatory Visit: Payer: Self-pay | Admitting: Family Medicine

## 2011-01-18 ENCOUNTER — Encounter (HOSPITAL_COMMUNITY): Payer: Self-pay | Admitting: *Deleted

## 2011-01-18 ENCOUNTER — Other Ambulatory Visit: Payer: Self-pay

## 2011-01-18 ENCOUNTER — Emergency Department (HOSPITAL_COMMUNITY)
Admission: EM | Admit: 2011-01-18 | Discharge: 2011-01-18 | Disposition: A | Discharge status: Home / Self Care | Payer: Medicaid Other | Attending: Emergency Medicine | Admitting: Emergency Medicine

## 2011-01-18 ENCOUNTER — Emergency Department (HOSPITAL_COMMUNITY): Payer: Medicaid Other

## 2011-01-18 DIAGNOSIS — E876 Hypokalemia: Secondary | ICD-10-CM

## 2011-01-18 DIAGNOSIS — I10 Essential (primary) hypertension: Secondary | ICD-10-CM | POA: Insufficient documentation

## 2011-01-18 DIAGNOSIS — R0789 Other chest pain: Secondary | ICD-10-CM | POA: Insufficient documentation

## 2011-01-18 DIAGNOSIS — R079 Chest pain, unspecified: Secondary | ICD-10-CM | POA: Insufficient documentation

## 2011-01-18 HISTORY — DX: Bipolar disorder, unspecified: F31.9

## 2011-01-18 HISTORY — DX: Essential (primary) hypertension: I10

## 2011-01-18 LAB — POCT I-STAT, CHEM 8
Creatinine, Ser: 1.2 mg/dL — ABNORMAL HIGH (ref 0.50–1.10)
HCT: 43 % (ref 36.0–46.0)
Hemoglobin: 14.6 g/dL (ref 12.0–15.0)
Potassium: 2.9 mEq/L — ABNORMAL LOW (ref 3.5–5.1)
Sodium: 141 mEq/L (ref 135–145)
TCO2: 28 mmol/L (ref 0–100)

## 2011-01-18 LAB — POCT I-STAT TROPONIN I: Troponin i, poc: 0 ng/mL (ref 0.00–0.08)

## 2011-01-18 LAB — CBC
HCT: 41.9 % (ref 36.0–46.0)
MCH: 29.6 pg (ref 26.0–34.0)
MCV: 84.3 fL (ref 78.0–100.0)
Platelets: 373 10*3/uL (ref 150–400)
RBC: 4.97 MIL/uL (ref 3.87–5.11)

## 2011-01-18 LAB — DIFFERENTIAL
Eosinophils Absolute: 0.1 10*3/uL (ref 0.0–0.7)
Eosinophils Relative: 1 % (ref 0–5)
Lymphocytes Relative: 33 % (ref 12–46)
Lymphs Abs: 3.9 10*3/uL (ref 0.7–4.0)
Monocytes Absolute: 0.6 10*3/uL (ref 0.1–1.0)
Monocytes Relative: 5 % (ref 3–12)

## 2011-01-18 LAB — POCT PREGNANCY, URINE: Preg Test, Ur: NEGATIVE

## 2011-01-18 MED ORDER — IBUPROFEN 800 MG PO TABS: 800.0000 mg | ORAL_TABLET | Freq: Once | ORAL | Status: DC

## 2011-01-18 MED ORDER — POTASSIUM CHLORIDE 20 MEQ/15ML (10%) PO LIQD
40.0000 meq | Freq: Once | ORAL | Status: AC
  Administered 2011-01-18: 40 meq via ORAL
  Filled 2011-01-18: qty 30

## 2011-01-18 MED ORDER — IBUPROFEN 400 MG PO TABS: 400.0000 mg | ORAL_TABLET | Freq: Three times a day (TID) | ORAL | Status: AC | PRN

## 2011-01-18 NOTE — ED Notes (Signed)
Pt states that she began to have left sided chest pain today around 1200.  Pt is here with her significant other and the two appear to be in a dispute about him going "to a bar".  Pt becomes visibly angry when this is mentioned.  Pt has no hx of same.

## 2011-01-18 NOTE — ED Notes (Signed)
Lab at bedside for blood draw.

## 2011-01-18 NOTE — ED Provider Notes (Signed)
History     CSN: [id number]  Arrival date & time 01/18/11  0008   First MD Initiated Contact with Patient 01/18/11 0111      Chief Complaint  Patient presents with  . Chest Pain     Patient is a 41 y.o. female presenting with chest pain. The history is provided by the patient.  Chest Pain   Patient reports complaint of left-sided chest pain since noon today. Pain has been a constant dull ache that extends into the left breast and left axilla. Patient also describes mid back pain. Patient denies nausea, vomiting, dizziness, sweating, fever, shortness of breath or any other associated symptoms. Pt is TTP over the (L) chest and (L) axilla. Patient admits that she was moving heavy furniture yesterday. Past Medical History  Diagnosis Date  . Hypertension   . Bipolar 1 disorder     Past Surgical History  Procedure Date  . Hysteroscopy     History reviewed. No pertinent family history.  History  Substance Use Topics  . Smoking status: Not on file  . Smokeless tobacco: Not on file  . Alcohol Use:     OB History    Grav Para Term Preterm Abortions TAB SAB Ect Mult Living                  Review of Systems  Constitutional: Negative.   HENT: Negative.   Eyes: Negative.   Respiratory: Negative.   Cardiovascular: Positive for chest pain.  Gastrointestinal: Negative.   Genitourinary: Negative.   Musculoskeletal: Negative.   Skin: Negative.   Neurological: Negative.   Hematological: Negative.   Psychiatric/Behavioral: Negative.     Allergies  Review of patient's allergies indicates no known allergies.  Home Medications   Current Outpatient Rx  Name Route Sig Dispense Refill  . ARIPIPRAZOLE 5 MG PO TABS Oral Take 5 mg by mouth daily.    Marland Kitchen BENAZEPRIL-HYDROCHLOROTHIAZIDE 20-25 MG PO TABS Oral Take 1 tablet by mouth daily.    . BUPROPION HCL ER (XL) 150 MG PO TB24 Oral Take 150 mg by mouth every morning.    Marland Kitchen LAMOTRIGINE 100 MG PO TABS Oral Take 50 mg by mouth  daily.    Marland Kitchen PHENTERMINE HCL 37.5 MG PO TABS Oral Take 18.75 mg by mouth daily before breakfast.      BP 129/74  Pulse 86  Temp(Src) 98.4 F (36.9 C) (Oral)  Resp 20  Ht 5\' 5"  (1.651 m)  Wt 178 lb (80.74 kg)  BMI 29.62 kg/m2  SpO2 100%  LMP 12/11/2010  Physical Exam  Constitutional: She is oriented to person, place, and time. She appears well-developed and well-nourished.  HENT:  Head: Normocephalic and atraumatic.  Eyes: Conjunctivae are normal.  Neck: Neck supple.  Cardiovascular: Normal rate, regular rhythm and normal pulses.   Pulmonary/Chest: Effort normal and breath sounds normal.  Abdominal: Soft. Bowel sounds are normal.  Musculoskeletal: Normal range of motion.  Neurological: She is alert and oriented to person, place, and time.  Skin: Skin is warm and dry. No erythema.  Psychiatric: She has a normal mood and affect.    ED Course  Procedures   Date: 01/18/2011  Rate: 85  Rhythm: normal sinus rhythm  QRS Axis: normal  Intervals: PR prolonged  ST/T Wave abnormalities: normal  Conduction Disutrbances:none  Narrative Interpretation: Unchanged  Old EKG Reviewed: unchanged  Patient given KCL 40 MEQ PO (liq) for K+ of 2.9. . Discussed findings and clinical impression with patient. Will plan for  discharge home on ibuprofen for musculoskeletal skeletal chest wall pain and encourage follow up with Dr.Avbuere as soon can be arranged for a recheck. Patient agreeable with plan.  Labs Reviewed  CBC - Abnormal; Notable for the following:    WBC 11.9 (*)    All other components within normal limits  POCT I-STAT, CHEM 8 - Abnormal; Notable for the following:    Potassium 2.9 (*)    Creatinine, Ser 1.20 (*)    All other components within normal limits  DIFFERENTIAL  POCT PREGNANCY, URINE  POCT I-STAT TROPONIN I  I-STAT, CHEM 8  I-STAT TROPONIN I  POCT PREGNANCY, URINE   Dg Chest Port 1 View  01/18/2011  *RADIOLOGY REPORT*  Clinical Data: Left chest pain  PORTABLE  CHEST - 1 VIEW  Comparison: Portable exam 0123 hours without priors for comparison.  Findings: Normal heart size, mediastinal contours, and pulmonary vascularity. Lungs clear. No pleural effusion or pneumothorax. Bones unremarkable.  IMPRESSION: No acute abnormalities.  Original Report Authenticated By: Lollie Marrow, M.D.     No diagnosis found.    MDM  Patient is 41 years old with risk  factors that include hypertension, slight obesity and use of NuvaRing for birth control. Pt is nonsmoker, no significant family history for ACS, pain is constant and not associated with any other symptoms. Patient is TTP across left chest wall and into left axilla. Recent hx of moving heavy furniture yesterday. EKG /Trop I and CXR normal. Doubt ACS. HPI/PE and clinical findings most c/w 1. Musculoskeltal chest pain        Leanne Chang, NP 01/18/11 2218

## 2011-01-18 NOTE — ED Notes (Signed)
Portable X-ray in.

## 2011-01-18 NOTE — ED Notes (Signed)
MD at bedside. 

## 2011-01-19 NOTE — ED Provider Notes (Signed)
Medical screening examination/treatment/procedure(s) were performed by non-physician practitioner and as supervising physician I was immediately available for consultation/collaboration.  Jasmine Awe, MD 01/19/11 [phone]

## 2011-02-12 ENCOUNTER — Other Ambulatory Visit: Payer: Self-pay | Admitting: Obstetrics

## 2011-10-14 ENCOUNTER — Other Ambulatory Visit: Payer: Self-pay | Admitting: Obstetrics

## 2011-10-14 DIAGNOSIS — Z1231 Encounter for screening mammogram for malignant neoplasm of breast: Secondary | ICD-10-CM

## 2011-10-24 ENCOUNTER — Ambulatory Visit (HOSPITAL_COMMUNITY): Payer: Medicaid Other | Attending: Obstetrics

## 2011-11-24 ENCOUNTER — Other Ambulatory Visit: Payer: Self-pay | Admitting: Obstetrics

## 2011-11-24 DIAGNOSIS — D219 Benign neoplasm of connective and other soft tissue, unspecified: Secondary | ICD-10-CM

## 2011-12-02 ENCOUNTER — Ambulatory Visit (HOSPITAL_COMMUNITY)
Admission: RE | Admit: 2011-12-02 | Discharge: 2011-12-02 | Disposition: A | Discharge status: Home / Self Care | Payer: Medicaid Other | Source: Ambulatory Visit | Attending: Obstetrics | Admitting: Obstetrics

## 2011-12-02 DIAGNOSIS — N854 Malposition of uterus: Secondary | ICD-10-CM | POA: Insufficient documentation

## 2011-12-02 DIAGNOSIS — D259 Leiomyoma of uterus, unspecified: Secondary | ICD-10-CM | POA: Insufficient documentation

## 2011-12-02 DIAGNOSIS — D219 Benign neoplasm of connective and other soft tissue, unspecified: Secondary | ICD-10-CM

## 2012-01-12 ENCOUNTER — Ambulatory Visit (HOSPITAL_COMMUNITY): Payer: Medicaid Other | Attending: Obstetrics

## 2012-07-27 ENCOUNTER — Other Ambulatory Visit: Payer: Self-pay | Admitting: *Deleted

## 2012-07-27 ENCOUNTER — Ambulatory Visit (INDEPENDENT_AMBULATORY_CARE_PROVIDER_SITE_OTHER): Payer: Medicaid Other | Admitting: Obstetrics

## 2012-07-27 ENCOUNTER — Encounter: Payer: Self-pay | Admitting: Obstetrics

## 2012-07-27 VITALS — BP 131/90 | HR 80 | Temp 98.1°F | Ht 65.0 in | Wt 191.0 lb

## 2012-07-27 DIAGNOSIS — Z139 Encounter for screening, unspecified: Secondary | ICD-10-CM

## 2012-07-27 DIAGNOSIS — Z1239 Encounter for other screening for malignant neoplasm of breast: Secondary | ICD-10-CM | POA: Insufficient documentation

## 2012-07-27 DIAGNOSIS — B9689 Other specified bacterial agents as the cause of diseases classified elsewhere: Secondary | ICD-10-CM | POA: Insufficient documentation

## 2012-07-27 DIAGNOSIS — Z113 Encounter for screening for infections with a predominantly sexual mode of transmission: Secondary | ICD-10-CM

## 2012-07-27 DIAGNOSIS — Z01419 Encounter for gynecological examination (general) (routine) without abnormal findings: Secondary | ICD-10-CM

## 2012-07-27 DIAGNOSIS — E669 Obesity, unspecified: Secondary | ICD-10-CM | POA: Insufficient documentation

## 2012-07-27 DIAGNOSIS — A499 Bacterial infection, unspecified: Secondary | ICD-10-CM

## 2012-07-27 DIAGNOSIS — Z0000 Encounter for general adult medical examination without abnormal findings: Secondary | ICD-10-CM

## 2012-07-27 DIAGNOSIS — N760 Acute vaginitis: Secondary | ICD-10-CM

## 2012-07-27 MED ORDER — METRONIDAZOLE 500 MG PO TABS: 500.0000 mg | ORAL_TABLET | Freq: Two times a day (BID) | ORAL | Status: DC

## 2012-07-27 MED ORDER — PHENTERMINE HCL 37.5 MG PO CAPS: 37.5000 mg | ORAL_CAPSULE | ORAL | Status: DC

## 2012-07-27 NOTE — Progress Notes (Signed)
  Subjective:     Emily Navarro is a 42 y.o. female who presents for evaluation of abdominal pain. The pain is described as burning, and is 7/10 in intensity. Pain is located in the  groin without radiation. Onset was ongoing occurring several days ago. Symptoms have been rapidly worsening since. Aggravating factors: none. Alleviating factors: none. Associated symptoms: none. The patient denies dysuria and fever. Risk factors for pelvic/abdominal pain include none.  Patient's last menstrual period was 06/30/2012.    The following portions of the patient's history were reviewed and updated as appropriate: allergies, current medications, past family history, past medical history, past social history, past surgical history and problem list.   Review of Systems Pertinent items are noted in HPI.    Objective:    BP 131/90  Pulse 80  Temp(Src) 98.1 F (36.7 C)  Ht 5\' 5"  (1.651 m)  Wt 191 lb (86.637 kg)  BMI 31.78 kg/m2  LMP 06/30/2012 General:   alert and no distress  Lungs:   not done  Heart:   regular rate and rhythm, S1, S2 normal, no murmur, click, rub or gallop  Abdomen:  normal findings: soft, non-tender  CVA:   absent  Pelvis:  Vulva and vagina appear normal. Bimanual exam reveals normal uterus and adnexa.  Extremities:   extremities normal, atraumatic, no cyanosis or edema  Neurologic:   negative  Psychiatric:   normal mood, behavior, speech, dress, and thought processes   Lab Review Labs: Urinalysis - with micro, GC/Chlamydia DNA probe of cervico/vaginal secretions and Wet mount of vaginal secretions   Imaging Mammogram ordered     Assessment:    Doing well.   BV Obesity   Plan:    The diagnosis was discussed with the patient and evaluation and treatment plans outlined. See orders for lab and imaging studies. Follow up in 1 year. or as needed. Mammogram ordered

## 2012-07-28 LAB — WET PREP BY MOLECULAR PROBE
Gardnerella vaginalis: NEGATIVE
Trichomonas vaginosis: NEGATIVE

## 2012-07-28 LAB — GC/CHLAMYDIA PROBE AMP: CT Probe RNA: NEGATIVE

## 2012-07-29 ENCOUNTER — Other Ambulatory Visit: Payer: Medicaid Other

## 2012-07-29 LAB — PAP IG W/ RFLX HPV ASCU

## 2012-07-30 LAB — HUMAN PAPILLOMAVIRUS, HIGH RISK: HPV DNA High Risk: NOT DETECTED

## 2012-08-16 ENCOUNTER — Ambulatory Visit: Payer: Self-pay | Admitting: Obstetrics

## 2012-11-11 ENCOUNTER — Other Ambulatory Visit: Payer: Self-pay | Admitting: Obstetrics

## 2013-01-13 ENCOUNTER — Ambulatory Visit: Payer: Medicaid Other | Admitting: Obstetrics

## 2013-01-26 ENCOUNTER — Other Ambulatory Visit: Payer: Self-pay | Admitting: Obstetrics

## 2013-01-31 ENCOUNTER — Encounter: Payer: Self-pay | Admitting: Obstetrics

## 2013-01-31 ENCOUNTER — Ambulatory Visit (INDEPENDENT_AMBULATORY_CARE_PROVIDER_SITE_OTHER): Payer: Medicaid Other | Admitting: Obstetrics

## 2013-01-31 VITALS — BP 136/88 | HR 87 | Temp 99.0°F | Ht 65.0 in | Wt 191.0 lb

## 2013-01-31 DIAGNOSIS — Z0000 Encounter for general adult medical examination without abnormal findings: Secondary | ICD-10-CM

## 2013-01-31 DIAGNOSIS — IMO0001 Reserved for inherently not codable concepts without codable children: Secondary | ICD-10-CM

## 2013-01-31 DIAGNOSIS — N946 Dysmenorrhea, unspecified: Secondary | ICD-10-CM

## 2013-01-31 DIAGNOSIS — E139 Other specified diabetes mellitus without complications: Secondary | ICD-10-CM

## 2013-01-31 DIAGNOSIS — E669 Obesity, unspecified: Secondary | ICD-10-CM

## 2013-01-31 DIAGNOSIS — Z3202 Encounter for pregnancy test, result negative: Secondary | ICD-10-CM

## 2013-01-31 LAB — POCT URINALYSIS DIPSTICK
Bilirubin, UA: NEGATIVE
Blood, UA: NEGATIVE
KETONES UA: NEGATIVE
LEUKOCYTES UA: NEGATIVE
Nitrite, UA: NEGATIVE
Protein, UA: NEGATIVE
SPEC GRAV UA: 1.015
UROBILINOGEN UA: NEGATIVE
pH, UA: 6

## 2013-01-31 LAB — POCT URINE PREGNANCY: PREG TEST UR: NEGATIVE

## 2013-01-31 MED ORDER — HYDROCODONE-IBUPROFEN 7.5-200 MG PO TABS: 1.0000 | ORAL_TABLET | Freq: Four times a day (QID) | ORAL | Status: DC | PRN

## 2013-01-31 MED ORDER — PHENTERMINE HCL 37.5 MG PO TABS: 37.5000 mg | ORAL_TABLET | Freq: Every day | ORAL | Status: DC

## 2013-01-31 MED ORDER — METFORMIN HCL ER 500 MG PO TB24: 500.0000 mg | ORAL_TABLET | Freq: Every day | ORAL | Status: DC

## 2013-01-31 NOTE — Progress Notes (Signed)
Subjective:     Emily Navarro is a 43 y.o. female here for a routine exam.  Current complaints: pt reports growths around anal area, denies pain or irritation.  Personal health questionnaire reviewed: yes.   Gynecologic History Patient's last menstrual period was 12/26/2012. Contraception: condoms Last Pap: 07/27/2012. Results were: abnormal Last mammogram: 07/15/2010. Results were: normal  Obstetric History OB History  No data available     The following portions of the patient's history were reviewed and updated as appropriate: allergies, current medications, past family history, past medical history, past social history, past surgical history and problem list.  Review of Systems Pertinent items are noted in HPI.    Objective:    General appearance: alert Breasts: normal appearance, no masses or tenderness Abdomen: normal findings: soft, non-tender Pelvic: cervix normal in appearance, external genitalia normal, no adnexal masses or tenderness, no cervical motion tenderness, rectovaginal septum normal, uterus normal size, shape, and consistency and vagina normal without discharge    Assessment:    Healthy female exam.    Plan:    Follow up in: 1 year.

## 2013-02-01 LAB — RPR

## 2013-02-01 LAB — WET PREP BY MOLECULAR PROBE
Candida species: NEGATIVE
Gardnerella vaginalis: NEGATIVE
TRICHOMONAS VAG: NEGATIVE

## 2013-02-01 LAB — HEMOGLOBIN A1C
HEMOGLOBIN A1C: 6 % — AB (ref <5.7)
MEAN PLASMA GLUCOSE: 126 mg/dL — AB (ref <117)

## 2013-02-01 LAB — HIV ANTIBODY (ROUTINE TESTING W REFLEX): HIV: NONREACTIVE

## 2013-02-01 LAB — HEPATITIS C ANTIBODY: HCV Ab: NEGATIVE

## 2013-02-01 LAB — GC/CHLAMYDIA PROBE AMP
CT PROBE, AMP APTIMA: NEGATIVE
GC Probe RNA: NEGATIVE

## 2013-02-01 LAB — HEPATITIS B SURFACE ANTIGEN: HEP B S AG: NEGATIVE

## 2013-02-03 ENCOUNTER — Encounter: Payer: Self-pay | Admitting: Obstetrics

## 2013-02-03 DIAGNOSIS — N946 Dysmenorrhea, unspecified: Secondary | ICD-10-CM | POA: Insufficient documentation

## 2013-02-14 ENCOUNTER — Other Ambulatory Visit: Payer: Self-pay | Admitting: Obstetrics

## 2013-02-23 ENCOUNTER — Other Ambulatory Visit: Payer: Self-pay | Admitting: Obstetrics

## 2013-02-23 DIAGNOSIS — N946 Dysmenorrhea, unspecified: Secondary | ICD-10-CM

## 2013-02-23 MED ORDER — HYDROMORPHONE HCL 4 MG PO TABS: 4.0000 mg | ORAL_TABLET | Freq: Four times a day (QID) | ORAL | Status: DC | PRN

## 2013-06-02 ENCOUNTER — Ambulatory Visit: Payer: Medicaid Other | Admitting: Obstetrics

## 2013-06-16 ENCOUNTER — Ambulatory Visit (INDEPENDENT_AMBULATORY_CARE_PROVIDER_SITE_OTHER): Payer: Medicaid Other | Admitting: Obstetrics

## 2013-06-16 ENCOUNTER — Encounter: Payer: Self-pay | Admitting: Obstetrics

## 2013-06-16 VITALS — BP 146/98 | HR 79 | Temp 97.4°F | Ht 65.0 in | Wt 189.0 lb

## 2013-06-16 DIAGNOSIS — B3749 Other urogenital candidiasis: Secondary | ICD-10-CM

## 2013-06-16 DIAGNOSIS — N946 Dysmenorrhea, unspecified: Secondary | ICD-10-CM

## 2013-06-16 DIAGNOSIS — E669 Obesity, unspecified: Secondary | ICD-10-CM

## 2013-06-16 MED ORDER — HYDROMORPHONE HCL 4 MG PO TABS: 4.0000 mg | ORAL_TABLET | Freq: Four times a day (QID) | ORAL | Status: DC | PRN

## 2013-06-16 MED ORDER — IBUPROFEN 800 MG PO TABS: 800.0000 mg | ORAL_TABLET | Freq: Three times a day (TID) | ORAL | Status: DC | PRN

## 2013-06-16 MED ORDER — FLUCONAZOLE 150 MG PO TABS: 150.0000 mg | ORAL_TABLET | Freq: Once | ORAL | Status: DC

## 2013-06-16 MED ORDER — PHENTERMINE HCL 37.5 MG PO TABS: 37.5000 mg | ORAL_TABLET | Freq: Every day | ORAL | Status: DC

## 2013-06-17 ENCOUNTER — Encounter: Payer: Self-pay | Admitting: Obstetrics

## 2013-06-17 LAB — WET PREP BY MOLECULAR PROBE
CANDIDA SPECIES: NEGATIVE
Gardnerella vaginalis: NEGATIVE
Trichomonas vaginosis: NEGATIVE

## 2013-06-17 LAB — GC/CHLAMYDIA PROBE AMP
CT Probe RNA: NEGATIVE
GC Probe RNA: NEGATIVE

## 2013-06-17 NOTE — Progress Notes (Signed)
Patient ID: Emily Navarro, female   DOB: Aug 05, 1970, 43 y.o.   MRN: [id number]  Chief Complaint  Patient presents with  . Dysmenorrhea    HPI Emily Navarro is a 43 y.o. female.  Painful periods.  HPI  Past Medical History  Diagnosis Date  . Hypertension   . Bipolar 1 disorder     Past Surgical History  Procedure Laterality Date  . Hysteroscopy      Family History  Problem Relation Age of Onset  . Thyroid disease Mother   . Diabetes Father   . Hypertension Father   . Cancer Father     Social History History  Substance Use Topics  . Smoking status: Never Smoker   . Smokeless tobacco: Never Used  . Alcohol Use: No    No Known Allergies  Current Outpatient Prescriptions  Medication Sig Dispense Refill  . benazepril-hydrochlorthiazide (LOTENSIN HCT) 20-25 MG per tablet Take 1 tablet by mouth daily.      . metFORMIN (GLUCOPHAGE XR) 500 MG 24 hr tablet Take 1 tablet (500 mg total) by mouth daily after supper.  30 tablet  11  . fluconazole (DIFLUCAN) 150 MG tablet Take 1 tablet (150 mg total) by mouth once.  1 tablet  2  . HYDROmorphone (DILAUDID) 4 MG tablet Take 1 tablet (4 mg total) by mouth every 6 (six) hours as needed for severe pain.  40 tablet  0  . ibuprofen (ADVIL,MOTRIN) 800 MG tablet Take 1 tablet (800 mg total) by mouth every 8 (eight) hours as needed.  30 tablet  5  . phentermine (ADIPEX-P) 37.5 MG tablet Take 1 tablet (37.5 mg total) by mouth daily before breakfast.  30 tablet  2   No current facility-administered medications for this visit.    Review of Systems Review of Systems Constitutional: negative for fatigue and weight loss Respiratory: negative for cough and wheezing Cardiovascular: negative for chest pain, fatigue and palpitations Gastrointestinal: negative for abdominal pain and change in bowel habits Genitourinary:negative Integument/breast: negative for nipple discharge Musculoskeletal:negative for myalgias Neurological: negative  for gait problems and tremors Behavioral/Psych: negative for abusive relationship, depression Endocrine: negative for temperature intolerance     Blood pressure 146/98, pulse 79, temperature 97.4 F (36.3 C), height 5\' 5"  (1.651 m), weight 189 lb (85.73 kg), last menstrual period 06/07/2013.  Physical Exam Physical Exam General:   alert  Skin:   no rash or abnormalities  Lungs:   clear to auscultation bilaterally  Heart:   regular rate and rhythm, S1, S2 normal, no murmur, click, rub or gallop  Breasts:   normal without suspicious masses, skin or nipple changes or axillary nodes  Abdomen:  normal findings: no organomegaly, soft, non-tender and no hernia  Pelvis:  External genitalia: normal general appearance Urinary system: urethral meatus normal and bladder without fullness, nontender Vaginal: normal without tenderness, induration or masses Cervix: normal appearance Adnexa: normal bimanual exam Uterus: anteverted and non-tender, normal size      Data Reviewed Labs  Assessment    Dysmenorrhea     Plan  Ibuprofen / Oxycodone Rx.    Orders Placed This Encounter  Procedures  . WET PREP BY MOLECULAR PROBE  . GC/Chlamydia Probe Amp   Meds ordered this encounter  Medications  . HYDROmorphone (DILAUDID) 4 MG tablet    Sig: Take 1 tablet (4 mg total) by mouth every 6 (six) hours as needed for severe pain.    Dispense:  40 tablet    Refill:  0  .  phentermine (ADIPEX-P) 37.5 MG tablet    Sig: Take 1 tablet (37.5 mg total) by mouth daily before breakfast.    Dispense:  30 tablet    Refill:  2  . ibuprofen (ADVIL,MOTRIN) 800 MG tablet    Sig: Take 1 tablet (800 mg total) by mouth every 8 (eight) hours as needed.    Dispense:  30 tablet    Refill:  5  . fluconazole (DIFLUCAN) 150 MG tablet    Sig: Take 1 tablet (150 mg total) by mouth once.    Dispense:  1 tablet    Refill:  2    Need to obtain previous records Possible management options include:  Hormornal regulation  of cycles but has HTN.  Endometrial Ablation.     HARPER,CHARLES A 06/17/2013, 1:02 AM

## 2013-09-13 ENCOUNTER — Encounter (HOSPITAL_BASED_OUTPATIENT_CLINIC_OR_DEPARTMENT_OTHER): Payer: Self-pay | Admitting: Emergency Medicine

## 2013-09-13 ENCOUNTER — Emergency Department (HOSPITAL_BASED_OUTPATIENT_CLINIC_OR_DEPARTMENT_OTHER)
Admission: EM | Admit: 2013-09-13 | Discharge: 2013-09-13 | Disposition: A | Discharge status: Home / Self Care | Payer: Medicaid Other | Attending: Emergency Medicine | Admitting: Emergency Medicine

## 2013-09-13 DIAGNOSIS — R109 Unspecified abdominal pain: Secondary | ICD-10-CM | POA: Diagnosis present

## 2013-09-13 DIAGNOSIS — M793 Panniculitis, unspecified: Secondary | ICD-10-CM | POA: Diagnosis not present

## 2013-09-13 DIAGNOSIS — R110 Nausea: Secondary | ICD-10-CM | POA: Insufficient documentation

## 2013-09-13 DIAGNOSIS — Z79899 Other long term (current) drug therapy: Secondary | ICD-10-CM | POA: Diagnosis not present

## 2013-09-13 DIAGNOSIS — I10 Essential (primary) hypertension: Secondary | ICD-10-CM | POA: Diagnosis not present

## 2013-09-13 DIAGNOSIS — Z8659 Personal history of other mental and behavioral disorders: Secondary | ICD-10-CM | POA: Diagnosis not present

## 2013-09-13 DIAGNOSIS — Z9889 Other specified postprocedural states: Secondary | ICD-10-CM | POA: Insufficient documentation

## 2013-09-13 LAB — PREGNANCY, URINE: Preg Test, Ur: NEGATIVE

## 2013-09-13 MED ORDER — CLINDAMYCIN HCL 300 MG PO CAPS: 300.0000 mg | ORAL_CAPSULE | Freq: Three times a day (TID) | ORAL | Status: DC

## 2013-09-13 MED ORDER — OXYCODONE-ACETAMINOPHEN 5-325 MG PO TABS: 1.0000 | ORAL_TABLET | ORAL | Status: DC | PRN

## 2013-09-13 NOTE — Discharge Instructions (Signed)

## 2013-09-13 NOTE — ED Notes (Signed)
Pt c/o diffuse adb pain x 2 days, hx of liposuction aug 28

## 2013-09-13 NOTE — ED Provider Notes (Signed)
CSN: [id number]     Arrival date & time 09/13/13  1445 History   First MD Initiated Contact with Patient 09/13/13 1500     Chief Complaint  Patient presents with  . Abdominal Pain     (Consider location/radiation/quality/duration/timing/severity/associated sxs/prior Treatment) Patient is a 43 y.o. female presenting with abdominal pain.  Abdominal Pain Pain location:  L flank, LLQ and RLQ Pain quality: aching   Pain radiates to:  Does not radiate Pain severity:  Moderate Onset quality:  Gradual Duration:  2 days Timing:  Constant Progression:  Worsening Chronicity:  New Context: previous surgery (recent liposuction)   Relieved by:  Nothing Worsened by:  Nothing tried Ineffective treatments:  None tried Associated symptoms: nausea   Associated symptoms: no anorexia, no chest pain, no chills, no constipation, no cough, no diarrhea, no dysuria, no fever, no shortness of breath, no sore throat, no vaginal bleeding, no vaginal discharge and no vomiting     Past Medical History  Diagnosis Date  . Hypertension   . Bipolar 1 disorder    Past Surgical History  Procedure Laterality Date  . Hysteroscopy     Family History  Problem Relation Age of Onset  . Thyroid disease Mother   . Diabetes Father   . Hypertension Father   . Cancer Father    History  Substance Use Topics  . Smoking status: Never Smoker   . Smokeless tobacco: Never Used  . Alcohol Use: No   OB History   Grav Para Term Preterm Abortions TAB SAB Ect Mult Living                 Review of Systems  Constitutional: Negative for fever and chills.  HENT: Negative for congestion, rhinorrhea and sore throat.   Eyes: Negative for photophobia and visual disturbance.  Respiratory: Negative for cough and shortness of breath.   Cardiovascular: Negative for chest pain and leg swelling.  Gastrointestinal: Positive for nausea and abdominal pain. Negative for vomiting, diarrhea, constipation and anorexia.  Endocrine:  Negative for polyphagia and polyuria.  Genitourinary: Negative for dysuria, flank pain, vaginal bleeding, vaginal discharge and enuresis.  Musculoskeletal: Negative for back pain and gait problem.  Skin: Negative for color change and rash.  Neurological: Negative for dizziness, syncope, light-headedness and numbness.  Hematological: Negative for adenopathy. Does not bruise/bleed easily.  All other systems reviewed and are negative.     Allergies  Review of patient's allergies indicates no known allergies.  Home Medications   Prior to Admission medications   Medication Sig Start Date End Date Taking? Authorizing Provider  benazepril-hydrochlorthiazide (LOTENSIN HCT) 20-25 MG per tablet Take 1 tablet by mouth daily.    Historical Provider, MD  clindamycin (CLEOCIN) 300 MG capsule Take 1 capsule (300 mg total) by mouth 3 (three) times daily. Only take if you develop redness, fever, or draining.  If you begin antibiotic course, complete entire course. 09/13/13   Debby Freiberg, MD  fluconazole (DIFLUCAN) 150 MG tablet Take 1 tablet (150 mg total) by mouth once. 06/16/13   Shelly Bombard, MD  HYDROmorphone (DILAUDID) 4 MG tablet Take 1 tablet (4 mg total) by mouth every 6 (six) hours as needed for severe pain. 06/16/13   Shelly Bombard, MD  ibuprofen (ADVIL,MOTRIN) 800 MG tablet Take 1 tablet (800 mg total) by mouth every 8 (eight) hours as needed. 06/16/13   Shelly Bombard, MD  metFORMIN (GLUCOPHAGE XR) 500 MG 24 hr tablet Take 1 tablet (500 mg total) by  mouth daily after supper. 01/31/13   Shelly Bombard, MD  oxyCODONE-acetaminophen (PERCOCET/ROXICET) 5-325 MG per tablet Take 1 tablet by mouth every 4 (four) hours as needed for severe pain. 09/13/13   Debby Freiberg, MD  phentermine (ADIPEX-P) 37.5 MG tablet Take 1 tablet (37.5 mg total) by mouth daily before breakfast. 06/16/13   Shelly Bombard, MD   BP 128/78  Pulse 84  Temp(Src) 98.1 F (36.7 C) (Oral)  Resp 18  Ht 5\' 5"  (1.651 m)   Wt 191 lb (86.637 kg)  BMI 31.78 kg/m2  SpO2 98%  LMP 07/24/2013 Physical Exam  Vitals reviewed. Constitutional: She is oriented to person, place, and time. She appears well-developed and well-nourished.  HENT:  Head: Normocephalic and atraumatic.  Right Ear: External ear normal.  Left Ear: External ear normal.  Eyes: Conjunctivae and EOM are normal. Pupils are equal, round, and reactive to light.  Neck: Normal range of motion. Neck supple.  Cardiovascular: Normal rate, regular rhythm, normal heart sounds and intact distal pulses.   Pulmonary/Chest: Effort normal and breath sounds normal.  Abdominal: Soft. Bowel sounds are normal. There is tenderness (over subcutaneous tissue, no abd tenderness away from sites of inflammation.).  Area of firmness consistent with panniculitis  Musculoskeletal: Normal range of motion.  Neurological: She is alert and oriented to person, place, and time.  Skin: Skin is warm and dry.    ED Course  Procedures (including critical care time) Labs Review Labs Reviewed  PREGNANCY, URINE    Imaging Review No results found.   EKG Interpretation None      MDM   Final diagnoses:  Panniculitis    42 y.o. female  with pertinent PMH of HTN, bipolar do presents with abd pain in context of liposuction 11 days ago.  No systemic symptoms.  Exam consistent with panniculitis.  Discussed strict return precautions with pt who voices understanding and agreed to fu.    Labs and imaging as above reviewed.   1. Panniculitis         Debby Freiberg, MD 09/13/13 [phone]

## 2013-09-14 NOTE — ED Notes (Signed)
Pt called stating that her prescriptions were not attached to her paperwork, informed patient that she was discharged at 1537, prescriptions would have been sent to our on site pharmacy and that she can pick them up in the am

## 2013-10-05 ENCOUNTER — Emergency Department (HOSPITAL_BASED_OUTPATIENT_CLINIC_OR_DEPARTMENT_OTHER)
Admission: EM | Admit: 2013-10-05 | Discharge: 2013-10-05 | Disposition: A | Discharge status: Home / Self Care | Payer: Medicaid Other | Attending: Emergency Medicine | Admitting: Emergency Medicine

## 2013-10-05 ENCOUNTER — Encounter (HOSPITAL_BASED_OUTPATIENT_CLINIC_OR_DEPARTMENT_OTHER): Payer: Self-pay | Admitting: Emergency Medicine

## 2013-10-05 ENCOUNTER — Emergency Department (HOSPITAL_BASED_OUTPATIENT_CLINIC_OR_DEPARTMENT_OTHER): Payer: Medicaid Other

## 2013-10-05 DIAGNOSIS — IMO0002 Reserved for concepts with insufficient information to code with codable children: Secondary | ICD-10-CM | POA: Insufficient documentation

## 2013-10-05 DIAGNOSIS — R079 Chest pain, unspecified: Secondary | ICD-10-CM | POA: Diagnosis not present

## 2013-10-05 DIAGNOSIS — Z79899 Other long term (current) drug therapy: Secondary | ICD-10-CM | POA: Insufficient documentation

## 2013-10-05 DIAGNOSIS — Z8659 Personal history of other mental and behavioral disorders: Secondary | ICD-10-CM | POA: Insufficient documentation

## 2013-10-05 DIAGNOSIS — I10 Essential (primary) hypertension: Secondary | ICD-10-CM | POA: Diagnosis present

## 2013-10-05 DIAGNOSIS — Z792 Long term (current) use of antibiotics: Secondary | ICD-10-CM | POA: Insufficient documentation

## 2013-10-05 LAB — COMPREHENSIVE METABOLIC PANEL
ALK PHOS: 121 U/L — AB (ref 39–117)
ALT: 33 U/L (ref 0–35)
AST: 34 U/L (ref 0–37)
Albumin: 3.4 g/dL — ABNORMAL LOW (ref 3.5–5.2)
Anion gap: 12 (ref 5–15)
BUN: 5 mg/dL — ABNORMAL LOW (ref 6–23)
CALCIUM: 9.5 mg/dL (ref 8.4–10.5)
CO2: 26 mEq/L (ref 19–32)
Chloride: 98 mEq/L (ref 96–112)
Creatinine, Ser: 0.9 mg/dL (ref 0.50–1.10)
GFR calc non Af Amer: 77 mL/min — ABNORMAL LOW (ref >90)
GFR, EST AFRICAN AMERICAN: 89 mL/min — AB (ref >90)
GLUCOSE: 152 mg/dL — AB (ref 70–99)
POTASSIUM: 3.5 meq/L — AB (ref 3.7–5.3)
SODIUM: 136 meq/L — AB (ref 137–147)
TOTAL PROTEIN: 7.7 g/dL (ref 6.0–8.3)
Total Bilirubin: 0.9 mg/dL (ref 0.3–1.2)

## 2013-10-05 LAB — CBC WITH DIFFERENTIAL/PLATELET
BASOS ABS: 0 10*3/uL (ref 0.0–0.1)
Basophils Relative: 0 % (ref 0–1)
EOS ABS: 0.1 10*3/uL (ref 0.0–0.7)
Eosinophils Relative: 1 % (ref 0–5)
HCT: 40.8 % (ref 36.0–46.0)
Hemoglobin: 13.8 g/dL (ref 12.0–15.0)
LYMPHS ABS: 2.6 10*3/uL (ref 0.7–4.0)
Lymphocytes Relative: 36 % (ref 12–46)
MCH: 29.6 pg (ref 26.0–34.0)
MCHC: 33.8 g/dL (ref 30.0–36.0)
MCV: 87.6 fL (ref 78.0–100.0)
Monocytes Absolute: 0.6 10*3/uL (ref 0.1–1.0)
Monocytes Relative: 8 % (ref 3–12)
NEUTROS PCT: 55 % (ref 43–77)
Neutro Abs: 4.1 10*3/uL (ref 1.7–7.7)
PLATELETS: 328 10*3/uL (ref 150–400)
RBC: 4.66 MIL/uL (ref 3.87–5.11)
RDW: 13.4 % (ref 11.5–15.5)
WBC: 7.3 10*3/uL (ref 4.0–10.5)

## 2013-10-05 LAB — TROPONIN I: Troponin I: <0.3 ng/mL (ref <0.30)

## 2013-10-05 MED ORDER — TRIAMTERENE-HCTZ 37.5-25 MG PO TABS: 1.0000 | ORAL_TABLET | Freq: Every day | ORAL | Status: DC

## 2013-10-05 MED ORDER — BENAZEPRIL-HYDROCHLOROTHIAZIDE 20-25 MG PO TABS: 1.0000 | ORAL_TABLET | Freq: Every day | ORAL | Status: DC

## 2013-10-05 NOTE — ED Notes (Addendum)
Pt c/o increased BP out of meds. Nurse called [facility] BP med is triacmeterene/hctz 37.5/25

## 2013-10-05 NOTE — ED Provider Notes (Signed)
Medical screening examination/treatment/procedure(s) were performed by non-physician practitioner and as supervising physician I was immediately available for consultation/collaboration.   EKG Interpretation   Date/Time:  Wednesday October 05 2013 17:12:15 EDT Ventricular Rate:  82 PR Interval:  174 QRS Duration: 90 QT Interval:  354 QTC Calculation: 413 R Axis:   51 Text Interpretation:  Normal sinus rhythm Normal ECG Confirmed by Betsey Holiday   MD, CHRISTOPHER ([id number]) on 10/05/2013 5:13:02 PM        Orpah Greek, MD 10/05/13 2233

## 2013-10-05 NOTE — Discharge Instructions (Signed)
Chest Pain (Nonspecific) °It is often hard to give a specific diagnosis for the cause of chest pain. There is always a chance that your pain could be related to something serious, such as a heart attack or a blood clot in the lungs. You need to follow up with your health care provider for further evaluation. °CAUSES  °· Heartburn. °· Pneumonia or bronchitis. °· Anxiety or stress. °· Inflammation around your heart (pericarditis) or lung (pleuritis or pleurisy). °· A blood clot in the lung. °· A collapsed lung (pneumothorax). It can develop suddenly on its own (spontaneous pneumothorax) or from trauma to the chest. °· Shingles infection (herpes zoster virus). °The chest wall is composed of bones, muscles, and cartilage. Any of these can be the source of the pain. °· The bones can be bruised by injury. °· The muscles or cartilage can be strained by coughing or overwork. °· The cartilage can be affected by inflammation and become sore (costochondritis). °DIAGNOSIS  °Lab tests or other studies may be needed to find the cause of your pain. Your health care provider may have you take a test called an ambulatory electrocardiogram (ECG). An ECG records your heartbeat patterns over a 24-hour period. You may also have other tests, such as: °· Transthoracic echocardiogram (TTE). During echocardiography, sound waves are used to evaluate how blood flows through your heart. °· Transesophageal echocardiogram (TEE). °· Cardiac monitoring. This allows your health care provider to monitor your heart rate and rhythm in real time. °· Holter monitor. This is a portable device that records your heartbeat and can help diagnose heart arrhythmias. It allows your health care provider to track your heart activity for several days, if needed. °· Stress tests by exercise or by giving medicine that makes the heart beat faster. °TREATMENT  °· Treatment depends on what may be causing your chest pain. Treatment may include: °¨ Acid blockers for  heartburn. °¨ Anti-inflammatory medicine. °¨ Pain medicine for inflammatory conditions. °¨ Antibiotics if an infection is present. °· You may be advised to change lifestyle habits. This includes stopping smoking and avoiding alcohol, caffeine, and chocolate. °· You may be advised to keep your head raised (elevated) when sleeping. This reduces the chance of acid going backward from your stomach into your esophagus. °Most of the time, nonspecific chest pain will improve within 2-3 days with rest and mild pain medicine.  °HOME CARE INSTRUCTIONS  °· If antibiotics were prescribed, take them as directed. Finish them even if you start to feel better. °· For the next few days, avoid physical activities that bring on chest pain. Continue physical activities as directed. °· Do not use any tobacco products, including cigarettes, chewing tobacco, or electronic cigarettes. °· Avoid drinking alcohol. °· Only take medicine as directed by your health care provider. °· Follow your health care provider's suggestions for further testing if your chest pain does not go away. °· Keep any follow-up appointments you made. If you do not go to an appointment, you could develop lasting (chronic) problems with pain. If there is any problem keeping an appointment, call to reschedule. °SEEK MEDICAL CARE IF:  °· Your chest pain does not go away, even after treatment. °· You have a rash with blisters on your chest. °· You have a fever. °SEEK IMMEDIATE MEDICAL CARE IF:  °· You have increased chest pain or pain that spreads to your arm, neck, jaw, back, or abdomen. °· You have shortness of breath. °· You have an increasing cough, or you cough   up blood. °· You have severe back or abdominal pain. °· You feel nauseous or vomit. °· You have severe weakness. °· You faint. °· You have chills. °This is an emergency. Do not wait to see if the pain will go away. Get medical help at once. Call your local emergency services (911 in [place] Do not drive  yourself to the hospital. °MAKE SURE YOU:  °· Understand these instructions. °· Will watch your condition. °· Will get help right away if you are not doing well or get worse. °Document Released: 10/02/2004 Document Revised: 12/28/2012 Document Reviewed: 07/29/2007 °ExitCare® Patient Information ©2015 [employer]. This information is not intended to replace advice given to you by your health care provider. Make sure you discuss any questions you have with your health care provider. ° °Hypertension °Hypertension, commonly called high blood pressure, is when the force of blood pumping through your arteries is too strong. Your arteries are the blood vessels that carry blood from your heart throughout your body. A blood pressure reading consists of a higher number over a lower number, such as 110/72. The higher number (systolic) is the pressure inside your arteries when your heart pumps. The lower number (diastolic) is the pressure inside your arteries when your heart relaxes. Ideally you want your blood pressure below 120/80. °Hypertension forces your heart to work harder to pump blood. Your arteries may become narrow or stiff. Having hypertension puts you at risk for heart disease, stroke, and other problems.  °RISK FACTORS °Some risk factors for high blood pressure are controllable. Others are not.  °Risk factors you cannot control include:  °· Race. You may be at higher risk if you are African American. °· Age. Risk increases with age. °· Gender. Men are at higher risk than women before age 45 years. After age 65, women are at higher risk than men. °Risk factors you can control include: °· Not getting enough exercise or physical activity. °· Being overweight. °· Getting too much fat, sugar, calories, or salt in your diet. °· Drinking too much alcohol. °SIGNS AND SYMPTOMS °Hypertension does not usually cause signs or symptoms. Extremely high blood pressure (hypertensive crisis) may cause headache, anxiety, shortness  of breath, and nosebleed. °DIAGNOSIS  °To check if you have hypertension, your health care provider will measure your blood pressure while you are seated, with your arm held at the level of your heart. It should be measured at least twice using the same arm. Certain conditions can cause a difference in blood pressure between your right and left arms. A blood pressure reading that is higher than normal on one occasion does not mean that you need treatment. If one blood pressure reading is high, ask your health care provider about having it checked again. °TREATMENT  °Treating high blood pressure includes making lifestyle changes and possibly taking medicine. Living a healthy lifestyle can help lower high blood pressure. You may need to change some of your habits. °Lifestyle changes may include: °· Following the DASH diet. This diet is high in fruits, vegetables, and whole grains. It is low in salt, red meat, and added sugars. °· Getting at least 2½ hours of brisk physical activity every week. °· Losing weight if necessary. °· Not smoking. °· Limiting alcoholic beverages. °· Learning ways to reduce stress. ° If lifestyle changes are not enough to get your blood pressure under control, your health care provider may prescribe medicine. You may need to take more than one. Work closely with your health care provider   to understand the risks and benefits. °HOME CARE INSTRUCTIONS °· Have your blood pressure rechecked as directed by your health care provider.   °· Take medicines only as directed by your health care provider. Follow the directions carefully. Blood pressure medicines must be taken as prescribed. The medicine does not work as well when you skip doses. Skipping doses also puts you at risk for problems.   °· Do not smoke.   °· Monitor your blood pressure at home as directed by your health care provider.  °SEEK MEDICAL CARE IF:  °· You think you are having a reaction to medicines taken. °· You have recurrent  headaches or feel dizzy. °· You have swelling in your ankles. °· You have trouble with your vision. °SEEK IMMEDIATE MEDICAL CARE IF: °· You develop a severe headache or confusion. °· You have unusual weakness, numbness, or feel faint. °· You have severe chest or abdominal pain. °· You vomit repeatedly. °· You have trouble breathing. °MAKE SURE YOU:  °· Understand these instructions. °· Will watch your condition. °· Will get help right away if you are not doing well or get worse. °Document Released: 12/23/2004 Document Revised: 05/09/2013 Document Reviewed: 10/15/2012 °ExitCare® Patient Information ©2015 [employer]. This information is not intended to replace advice given to you by your health care provider. Make sure you discuss any questions you have with your health care provider. ° °

## 2013-10-05 NOTE — ED Provider Notes (Signed)
CSN: [id number]     Arrival date & time 10/05/13  1640 History   First MD Initiated Contact with Patient 10/05/13 1659     Chief Complaint  Patient presents with  . Hypertension     (Consider location/radiation/quality/duration/timing/severity/associated sxs/prior Treatment) Patient is a 43 y.o. female presenting with hypertension. The history is provided by the patient. No language interpreter was used.  Hypertension This is a new problem. The current episode started today. The problem occurs constantly. The problem has been gradually worsening. Pertinent negatives include no nausea. Nothing aggravates the symptoms. She has tried nothing for the symptoms. The treatment provided no relief.  Pt reports she had an episode of chest pain that lasted for about a minute.  Pt reports she had also had a headache and vision seems blurry.   Pt came in because she is concerned that symptoms are second to high blood pressure. Pt is out of her blood pressure medications.  Pt called her MD but he can not see and will not refill until she is seen.  Past Medical History  Diagnosis Date  . Hypertension   . Bipolar 1 disorder    Past Surgical History  Procedure Laterality Date  . Hysteroscopy     Family History  Problem Relation Age of Onset  . Thyroid disease Mother   . Diabetes Father   . Hypertension Father   . Cancer Father    History  Substance Use Topics  . Smoking status: Never Smoker   . Smokeless tobacco: Never Used  . Alcohol Use: No   OB History   Grav Para Term Preterm Abortions TAB SAB Ect Mult Living                 Review of Systems  Gastrointestinal: Negative for nausea.  All other systems reviewed and are negative.     Allergies  Review of patient's allergies indicates no known allergies.  Home Medications   Prior to Admission medications   Medication Sig Start Date End Date Taking? Authorizing Provider  benazepril-hydrochlorthiazide (LOTENSIN HCT) 20-25 MG per  tablet Take 1 tablet by mouth daily.    Historical Provider, MD  clindamycin (CLEOCIN) 300 MG capsule Take 1 capsule (300 mg total) by mouth 3 (three) times daily. Only take if you develop redness, fever, or draining.  If you begin antibiotic course, complete entire course. 09/13/13   Debby Freiberg, MD  fluconazole (DIFLUCAN) 150 MG tablet Take 1 tablet (150 mg total) by mouth once. 06/16/13   Shelly Bombard, MD  HYDROmorphone (DILAUDID) 4 MG tablet Take 1 tablet (4 mg total) by mouth every 6 (six) hours as needed for severe pain. 06/16/13   Shelly Bombard, MD  ibuprofen (ADVIL,MOTRIN) 800 MG tablet Take 1 tablet (800 mg total) by mouth every 8 (eight) hours as needed. 06/16/13   Shelly Bombard, MD  metFORMIN (GLUCOPHAGE XR) 500 MG 24 hr tablet Take 1 tablet (500 mg total) by mouth daily after supper. 01/31/13   Shelly Bombard, MD  oxyCODONE-acetaminophen (PERCOCET/ROXICET) 5-325 MG per tablet Take 1 tablet by mouth every 4 (four) hours as needed for severe pain. 09/13/13   Debby Freiberg, MD  phentermine (ADIPEX-P) 37.5 MG tablet Take 1 tablet (37.5 mg total) by mouth daily before breakfast. 06/16/13   Shelly Bombard, MD   BP 142/99  Pulse 81  Temp(Src) 99 F (37.2 C)  Resp 18  Wt 194 lb (87.998 kg)  SpO2 98%  LMP 09/30/2013 Physical Exam  Nursing note and vitals reviewed. Constitutional: She is oriented to person, place, and time. She appears well-developed and well-nourished.  HENT:  Head: Normocephalic and atraumatic.  Eyes: Conjunctivae and EOM are normal. Pupils are equal, round, and reactive to light.  Neck: Normal range of motion.  Cardiovascular: Normal rate and normal heart sounds.   Pulmonary/Chest: Effort normal.  Abdominal: Soft. She exhibits no distension.  Musculoskeletal: Normal range of motion.  Neurological: She is alert and oriented to person, place, and time.  Skin: Skin is warm.  Psychiatric: She has a normal mood and affect.    ED Course  Procedures  (including critical care time) Labs Review Labs Reviewed  COMPREHENSIVE METABOLIC PANEL - Abnormal; Notable for the following:    Sodium 136 (*)    Potassium 3.5 (*)    Glucose, Bld 152 (*)    BUN 5 (*)    Albumin 3.4 (*)    Alkaline Phosphatase 121 (*)    GFR calc non Af Amer 77 (*)    GFR calc Af Amer 89 (*)    All other components within normal limits  CBC WITH DIFFERENTIAL  TROPONIN I    Imaging Review Dg Chest 2 View  10/05/2013   CLINICAL DATA:  Chest pain  EXAM: CHEST  2 VIEW  COMPARISON:  01/18/2011  FINDINGS: The heart size and mediastinal contours are within normal limits. Both lungs are clear. The visualized skeletal structures are unremarkable.  IMPRESSION: No active cardiopulmonary disease.   Electronically Signed   By: Inez Catalina M.D.   On: 10/05/2013 17:42     EKG Interpretation   Date/Time:  Wednesday October 05 2013 17:12:15 EDT Ventricular Rate:  82 PR Interval:  174 QRS Duration: 90 QT Interval:  354 QTC Calculation: 413 R Axis:   51 Text Interpretation:  Normal sinus rhythm Normal ECG Confirmed by POLLINA   MD, CHRISTOPHER ([id number]) on 10/05/2013 5:13:02 PM      MDM  Pt looks good.   I doubt cardiac cause of pain.   I will prescribe pt her medication.  She is given resource guide for finding a primary care MD.     Final diagnoses:  Essential hypertension  Nonspecific chest pain        Fransico Meadow, [place] 10/05/13 1779

## 2013-10-05 NOTE — ED Notes (Addendum)
Pt with Hx of HTN. Pt out of med for two days. Pt co of headache, blurred vision " see stars", and chest pain. Pt denies SOB and lightheadeness.

## 2013-12-13 ENCOUNTER — Ambulatory Visit (INDEPENDENT_AMBULATORY_CARE_PROVIDER_SITE_OTHER): Payer: Medicaid Other | Admitting: Obstetrics

## 2013-12-13 VITALS — BP 136/85 | HR 63 | Wt 192.0 lb

## 2013-12-13 DIAGNOSIS — N762 Acute vulvitis: Secondary | ICD-10-CM | POA: Insufficient documentation

## 2013-12-13 DIAGNOSIS — A499 Bacterial infection, unspecified: Secondary | ICD-10-CM

## 2013-12-13 DIAGNOSIS — N760 Acute vaginitis: Secondary | ICD-10-CM

## 2013-12-13 DIAGNOSIS — E139 Other specified diabetes mellitus without complications: Secondary | ICD-10-CM | POA: Insufficient documentation

## 2013-12-13 DIAGNOSIS — N946 Dysmenorrhea, unspecified: Secondary | ICD-10-CM

## 2013-12-13 DIAGNOSIS — B3731 Acute candidiasis of vulva and vagina: Secondary | ICD-10-CM

## 2013-12-13 DIAGNOSIS — B373 Candidiasis of vulva and vagina: Secondary | ICD-10-CM

## 2013-12-13 DIAGNOSIS — I10 Essential (primary) hypertension: Secondary | ICD-10-CM

## 2013-12-13 DIAGNOSIS — B9689 Other specified bacterial agents as the cause of diseases classified elsewhere: Secondary | ICD-10-CM

## 2013-12-13 MED ORDER — LIDOCAINE HCL 2 % EX GEL: 1.0000 "application " | CUTANEOUS | Status: DC | PRN

## 2013-12-13 MED ORDER — CLINDAMYCIN HCL 300 MG PO CAPS: 300.0000 mg | ORAL_CAPSULE | Freq: Three times a day (TID) | ORAL | Status: DC

## 2013-12-13 MED ORDER — ACETAMINOPHEN-CODEINE #2 300-15 MG PO TABS: 1.0000 | ORAL_TABLET | ORAL | Status: DC | PRN

## 2013-12-13 MED ORDER — METFORMIN HCL ER 500 MG PO TB24: 500.0000 mg | ORAL_TABLET | Freq: Two times a day (BID) | ORAL | Status: DC

## 2013-12-13 MED ORDER — FLUCONAZOLE 150 MG PO TABS: 150.0000 mg | ORAL_TABLET | Freq: Once | ORAL | Status: DC

## 2013-12-13 MED ORDER — TRIAMTERENE-HCTZ 37.5-25 MG PO TABS
1.0000 | ORAL_TABLET | Freq: Every day | ORAL | Status: AC
Start: 2013-12-13 — End: ?

## 2013-12-14 ENCOUNTER — Encounter: Payer: Self-pay | Admitting: Obstetrics

## 2013-12-14 NOTE — Progress Notes (Signed)
Patient ID: Emily Navarro, female   DOB: 06/28/1970, 43 y.o.   MRN: [id number]  Chief Complaint  Patient presents with  . Sexual Problem    HPI Emily Navarro is a 43 y.o. female.  Pain with intercourse.  Vaginal discharge.  HPI  Past Medical History  Diagnosis Date  . Hypertension   . Bipolar 1 disorder     Past Surgical History  Procedure Laterality Date  . Hysteroscopy      Family History  Problem Relation Age of Onset  . Thyroid disease Mother   . Diabetes Father   . Hypertension Father   . Cancer Father     Social History History  Substance Use Topics  . Smoking status: Never Smoker   . Smokeless tobacco: Never Used  . Alcohol Use: No    No Known Allergies  Current Outpatient Prescriptions  Medication Sig Dispense Refill  . HYDROmorphone (DILAUDID) 4 MG tablet Take 1 tablet (4 mg total) by mouth every 6 (six) hours as needed for severe pain. 40 tablet 0  . ibuprofen (ADVIL,MOTRIN) 800 MG tablet Take 1 tablet (800 mg total) by mouth every 8 (eight) hours as needed. 30 tablet 5  . metFORMIN (GLUCOPHAGE XR) 500 MG 24 hr tablet Take 1 tablet (500 mg total) by mouth 2 (two) times daily. 60 tablet 11  . triamterene-hydrochlorothiazide (MAXZIDE-25) 37.5-25 MG per tablet Take 1 tablet by mouth daily. 30 tablet 2  . acetaminophen-codeine (TYLENOL #2) 300-15 MG per tablet Take 1 tablet by mouth every 4 (four) hours as needed for moderate pain. 40 tablet 0  . benazepril-hydrochlorthiazide (LOTENSIN HCT) 20-25 MG per tablet Take 1 tablet by mouth daily. (Patient not taking: Reported on 12/13/2013) 30 tablet 1  . clindamycin (CLEOCIN) 300 MG capsule Take 1 capsule (300 mg total) by mouth 3 (three) times daily. 21 capsule 2  . fluconazole (DIFLUCAN) 150 MG tablet Take 1 tablet (150 mg total) by mouth once. (Patient not taking: Reported on 12/13/2013) 1 tablet 2  . fluconazole (DIFLUCAN) 150 MG tablet Take 1 tablet (150 mg total) by mouth once. 1 tablet 2  . lidocaine  (XYLOCAINE JELLY) 2 % jelly Apply 1 application topically as needed. 30 mL 2  . oxyCODONE-acetaminophen (PERCOCET/ROXICET) 5-325 MG per tablet Take 1 tablet by mouth every 4 (four) hours as needed for severe pain. (Patient not taking: Reported on 12/13/2013) 15 tablet 0  . phentermine (ADIPEX-P) 37.5 MG tablet Take 1 tablet (37.5 mg total) by mouth daily before breakfast. (Patient not taking: Reported on 12/13/2013) 30 tablet 2  . triamterene-hydrochlorothiazide (MAXZIDE-25) 37.5-25 MG per tablet Take 1 tablet by mouth daily. 30 tablet 11   No current facility-administered medications for this visit.    Review of Systems Review of Systems Constitutional: negative for fatigue and weight loss Respiratory: negative for cough and wheezing Cardiovascular: negative for chest pain, fatigue and palpitations Gastrointestinal: negative for abdominal pain and change in bowel habits Genitourinary:negative Integument/breast: negative for nipple discharge Musculoskeletal:negative for myalgias Neurological: negative for gait problems and tremors Behavioral/Psych: negative for abusive relationship, depression Endocrine: negative for temperature intolerance     Blood pressure 136/85, pulse 63, weight 192 lb (87.091 kg), last menstrual period 11/13/2013.  Physical Exam Physical Exam General:   alert  Skin:   no rash or abnormalities  Lungs:   clear to auscultation bilaterally  Heart:   regular rate and rhythm, S1, S2 normal, no murmur, click, rub or gallop  Breasts:   normal without suspicious  masses, skin or nipple changes or axillary nodes  Abdomen:  normal findings: no organomegaly, soft, non-tender and no hernia  Pelvis:  External genitalia: normal general appearance Urinary system: urethral meatus normal and bladder without fullness, nontender Vaginal: normal without tenderness, induration or masses Cervix: normal appearance Adnexa: normal bimanual exam Uterus: anteverted and non-tender, normal  size     Data Reviewed Labs  Assessment    Vulvitis Hypertension Diabetes, Type 2 BV     Plan    Lidocaine Gel Rx Maxzide Rx Metformin Rx Clindamycin Rx  Orders Placed This Encounter  Procedures  . SureSwab Bacterial Vaginosis/itis   Meds ordered this encounter  Medications  . metFORMIN (GLUCOPHAGE XR) 500 MG 24 hr tablet    Sig: Take 1 tablet (500 mg total) by mouth 2 (two) times daily.    Dispense:  60 tablet    Refill:  11  . triamterene-hydrochlorothiazide (MAXZIDE-25) 37.5-25 MG per tablet    Sig: Take 1 tablet by mouth daily.    Dispense:  30 tablet    Refill:  11  . lidocaine (XYLOCAINE JELLY) 2 % jelly    Sig: Apply 1 application topically as needed.    Dispense:  30 mL    Refill:  2  . acetaminophen-codeine (TYLENOL #2) 300-15 MG per tablet    Sig: Take 1 tablet by mouth every 4 (four) hours as needed for moderate pain.    Dispense:  40 tablet    Refill:  0  . DISCONTD: clindamycin (CLEOCIN) 300 MG capsule    Sig: Take 1 capsule (300 mg total) by mouth 3 (three) times daily. Only take if you develop redness, fever, or draining.  If you begin antibiotic course, complete entire course.    Dispense:  21 capsule    Refill:  0  . fluconazole (DIFLUCAN) 150 MG tablet    Sig: Take 1 tablet (150 mg total) by mouth once.    Dispense:  1 tablet    Refill:  2  . clindamycin (CLEOCIN) 300 MG capsule    Sig: Take 1 capsule (300 mg total) by mouth 3 (three) times daily.    Dispense:  21 capsule    Refill:  2      Emily Navarro A 12/14/2013, 4:42 AM

## 2013-12-17 LAB — SURESWAB BACTERIAL VAGINOSIS/ITIS
Atopobium vaginae: 7.5 Log (cells/mL)
C. TROPICALIS, DNA: NOT DETECTED
C. albicans, DNA: NOT DETECTED
C. glabrata, DNA: NOT DETECTED
C. parapsilosis, DNA: NOT DETECTED
LACTOBACILLUS SPECIES: NOT DETECTED Log (cells/mL)
MEGASPHAERA SPECIES: 8 Log (cells/mL)
T. vaginalis RNA, QL TMA: NOT DETECTED

## 2013-12-19 ENCOUNTER — Other Ambulatory Visit: Payer: Self-pay | Admitting: *Deleted

## 2013-12-19 DIAGNOSIS — E139 Other specified diabetes mellitus without complications: Secondary | ICD-10-CM

## 2013-12-19 MED ORDER — GLUCOSE BLOOD VI STRP: ORAL_STRIP | Status: AC

## 2013-12-19 MED ORDER — WALGREENS THIN LANCETS MISC: 60.0000 | Freq: Two times a day (BID) | Status: AC

## 2013-12-20 ENCOUNTER — Other Ambulatory Visit: Payer: Self-pay | Admitting: Obstetrics

## 2013-12-20 DIAGNOSIS — B9689 Other specified bacterial agents as the cause of diseases classified elsewhere: Secondary | ICD-10-CM

## 2013-12-20 DIAGNOSIS — N760 Acute vaginitis: Principal | ICD-10-CM

## 2013-12-20 MED ORDER — CLINDAMYCIN HCL 300 MG PO CAPS: 300.0000 mg | ORAL_CAPSULE | Freq: Three times a day (TID) | ORAL | Status: DC

## 2014-04-23 ENCOUNTER — Other Ambulatory Visit: Payer: Self-pay | Admitting: Obstetrics

## 2014-04-27 ENCOUNTER — Telehealth: Payer: Self-pay | Admitting: *Deleted

## 2014-04-27 NOTE — Telephone Encounter (Signed)
Patient would like a refill of her Phentermine. Patient has started exercising and has been diagnosed with diabetes- she is trying to loss some weight. Patient would like her Rx sent to [facility]/ [address]/[place]. 3:30 Call to patient -will forward call to provider and let her know response.

## 2014-05-03 ENCOUNTER — Other Ambulatory Visit: Payer: Self-pay | Admitting: Obstetrics

## 2014-05-11 ENCOUNTER — Encounter: Payer: Self-pay | Admitting: Certified Nurse Midwife

## 2014-05-11 ENCOUNTER — Other Ambulatory Visit: Payer: Self-pay | Admitting: Certified Nurse Midwife

## 2014-05-11 ENCOUNTER — Telehealth: Payer: Self-pay

## 2014-05-11 ENCOUNTER — Ambulatory Visit (INDEPENDENT_AMBULATORY_CARE_PROVIDER_SITE_OTHER): Payer: Medicaid Other | Admitting: Certified Nurse Midwife

## 2014-05-11 VITALS — BP 143/98 | HR 75 | Wt 191.0 lb

## 2014-05-11 DIAGNOSIS — B373 Candidiasis of vulva and vagina: Secondary | ICD-10-CM

## 2014-05-11 DIAGNOSIS — E1159 Type 2 diabetes mellitus with other circulatory complications: Secondary | ICD-10-CM

## 2014-05-11 DIAGNOSIS — Z01419 Encounter for gynecological examination (general) (routine) without abnormal findings: Secondary | ICD-10-CM

## 2014-05-11 DIAGNOSIS — Z0000 Encounter for general adult medical examination without abnormal findings: Secondary | ICD-10-CM

## 2014-05-11 DIAGNOSIS — Z1231 Encounter for screening mammogram for malignant neoplasm of breast: Secondary | ICD-10-CM

## 2014-05-11 DIAGNOSIS — B3731 Acute candidiasis of vulva and vagina: Secondary | ICD-10-CM

## 2014-05-11 LAB — COMPREHENSIVE METABOLIC PANEL
ALK PHOS: 99 U/L (ref 39–117)
ALT: 24 U/L (ref 0–35)
AST: 18 U/L (ref 0–37)
Albumin: 4 g/dL (ref 3.5–5.2)
BUN: 8 mg/dL (ref 6–23)
CO2: 25 mEq/L (ref 19–32)
CREATININE: 0.84 mg/dL (ref 0.50–1.10)
Calcium: 9.6 mg/dL (ref 8.4–10.5)
Chloride: 100 mEq/L (ref 96–112)
Glucose, Bld: 111 mg/dL — ABNORMAL HIGH (ref 70–99)
Potassium: 3.5 mEq/L (ref 3.5–5.3)
Sodium: 136 mEq/L (ref 135–145)
Total Bilirubin: 1.5 mg/dL — ABNORMAL HIGH (ref 0.2–1.2)
Total Protein: 7.3 g/dL (ref 6.0–8.3)

## 2014-05-11 LAB — TSH: TSH: 2.352 u[IU]/mL (ref 0.350–4.500)

## 2014-05-11 LAB — HEMOGLOBIN A1C
Hgb A1c MFr Bld: 6.4 % — ABNORMAL HIGH (ref <5.7)
Mean Plasma Glucose: 137 mg/dL — ABNORMAL HIGH (ref <117)

## 2014-05-11 LAB — CBC
HCT: 44 % (ref 36.0–46.0)
Hemoglobin: 14.8 g/dL (ref 12.0–15.0)
MCH: 28.7 pg (ref 26.0–34.0)
MCHC: 33.6 g/dL (ref 30.0–36.0)
MCV: 85.3 fL (ref 78.0–100.0)
MPV: 9.2 fL (ref 8.6–12.4)
Platelets: 426 10*3/uL — ABNORMAL HIGH (ref 150–400)
RBC: 5.16 MIL/uL — ABNORMAL HIGH (ref 3.87–5.11)
RDW: 13.7 % (ref 11.5–15.5)
WBC: 9 10*3/uL (ref 4.0–10.5)

## 2014-05-11 LAB — TRIGLYCERIDES: TRIGLYCERIDES: 244 mg/dL — AB (ref <150)

## 2014-05-11 LAB — HDL CHOLESTEROL: HDL: 41 mg/dL — ABNORMAL LOW (ref >=46)

## 2014-05-11 LAB — CHOLESTEROL, TOTAL: CHOLESTEROL: 147 mg/dL (ref 0–200)

## 2014-05-11 MED ORDER — FLUCONAZOLE 100 MG PO TABS: 100.0000 mg | ORAL_TABLET | Freq: Once | ORAL | Status: DC

## 2014-05-11 NOTE — Telephone Encounter (Signed)
Patient has appt with endocrinologist Dr. Delrae Rend on 09/11/13 at [facility] - booked out, but one of the few endocrinologists taking medicaid - she will call them periodically to check schedule

## 2014-05-11 NOTE — Progress Notes (Signed)
Patient ID: Emily Navarro, female   DOB: [birth year], 44 y.o.   MRN: [id number]   Subjective:      Emily Navarro is a 44 y.o. female here for a routine exam.  Current complaints: desires to have blood sugars more under control.  Had abdominal plasty March 17, 2014 and is having lower abdominal swelling since the surgery, encouraged to wear abdominal binder.  Still having periods, denies any problem with menses.  Using condoms for contraception.    Personal health questionnaire:  Is patient Emily Navarro, have a family history of breast and/or ovarian cancer: no Is there a family history of uterine cancer diagnosed at age < 59, gastrointestinal cancer, urinary tract cancer, family member who is a Field seismologist syndrome-associated carrier: no Is the patient overweight and hypertensive, family history of diabetes, personal history of gestational diabetes, preeclampsia or PCOS: yes Is patient over 70, have PCOS,  family history of premature CHD under age 63, diabetes, smoke, have hypertension or peripheral artery disease:  yes At any time, has a partner hit, kicked or otherwise hurt or frightened you?: no Over the past 2 weeks, have you felt down, depressed or hopeless?: no Over the past 2 weeks, have you felt little interest or pleasure in doing things?:no   Gynecologic History Patient's last menstrual period was 04/16/2014. Contraception: condoms Last Pap: 07/27/12. Results were: normal Last mammogram: 2013. Results were: normal  Obstetric History OB History  No data available    Past Medical History  Diagnosis Date  . Hypertension   . Bipolar 1 disorder     Past Surgical History  Procedure Laterality Date  . Hysteroscopy    . Abdominoplasty       Current outpatient prescriptions:  .  glucose blood test strip, Use as instructed . Patient uses Sure to go meter, Disp: 100 each, Rfl: 12 .  ibuprofen (ADVIL,MOTRIN) 800 MG tablet, TAKE 1 TABLET (800 MG TOTAL) BY MOUTH EVERY 8  (EIGHT) HOURS AS NEEDED., Disp: 30 tablet, Rfl: 5 .  metFORMIN (GLUCOPHAGE XR) 500 MG 24 hr tablet, Take 1 tablet (500 mg total) by mouth 2 (two) times daily., Disp: 60 tablet, Rfl: 11 .  triamterene-hydrochlorothiazide (MAXZIDE-25) 37.5-25 MG per tablet, Take 1 tablet by mouth daily., Disp: 30 tablet, Rfl: 11 .  [facility] THIN LANCETS MISC, 60 Sticks by Does not apply route 2 (two) times daily. Patient needs lancets to go with sure to go lancet device. If patient does not have you may fill lancet device and lancets generic brand. Patient to check CBG bid., Disp: 100 each, Rfl: 0 .  acetaminophen-codeine (TYLENOL #2) 300-15 MG per tablet, Take 1 tablet by mouth every 4 (four) hours as needed for moderate pain. (Patient not taking: Reported on 05/11/2014), Disp: 40 tablet, Rfl: 0 .  benazepril-hydrochlorthiazide (LOTENSIN HCT) 20-25 MG per tablet, Take 1 tablet by mouth daily. (Patient not taking: Reported on 12/13/2013), Disp: 30 tablet, Rfl: 1 .  clindamycin (CLEOCIN) 300 MG capsule, Take 1 capsule (300 mg total) by mouth 3 (three) times daily. (Patient not taking: Reported on 05/11/2014), Disp: 21 capsule, Rfl: 2 .  fluconazole (DIFLUCAN) 100 MG tablet, Take 1 tablet (100 mg total) by mouth once. Repeat dose in 48-72 hours., Disp: 3 tablet, Rfl: 2 .  HYDROmorphone (DILAUDID) 4 MG tablet, Take 1 tablet (4 mg total) by mouth every 6 (six) hours as needed for severe pain. (Patient not taking: Reported on 05/11/2014), Disp: 40 tablet, Rfl: 0 .  phentermine (ADIPEX-P) 37.5  MG tablet, Take 1 tablet (37.5 mg total) by mouth daily before breakfast. (Patient not taking: Reported on 12/13/2013), Disp: 30 tablet, Rfl: 2 .  triamterene-hydrochlorothiazide (MAXZIDE-25) 37.5-25 MG per tablet, Take 1 tablet by mouth daily., Disp: 30 tablet, Rfl: 2 No Known Allergies  History  Substance Use Topics  . Smoking status: Never Smoker   . Smokeless tobacco: Never Used  . Alcohol Use: No    Family History  Problem Relation  Age of Onset  . Thyroid disease Mother   . Diabetes Father   . Hypertension Father   . Cancer Father       Review of Systems  Constitutional: negative for fatigue and weight loss Respiratory: negative for cough and wheezing Cardiovascular: negative for chest pain, fatigue and palpitations Gastrointestinal: negative for abdominal pain and change in bowel habits Musculoskeletal:negative for myalgias Neurological: negative for gait problems and tremors Behavioral/Psych: negative for abusive relationship, depression Endocrine: negative for temperature intolerance   Genitourinary:negative for abnormal menstrual periods, genital lesions, hot flashes, sexual problems and vaginal discharge Integument/breast: negative for breast lump, breast tenderness, nipple discharge and skin lesion(s)    Objective:       BP 143/98 mmHg  Pulse 75  Wt 86.637 kg (191 lb)  LMP 04/16/2014 General:   alert  Skin:   no rash or abnormalities  Lungs:   clear to auscultation bilaterally  Heart:   regular rate and rhythm, S1, S2 normal, no murmur, click, rub or gallop  Breasts:   normal without suspicious masses, skin or nipple changes or axillary nodes  Abdomen:  normal findings: no organomegaly, soft, non-tender and no hernia  Pelvis:  External genitalia: normal general appearance Urinary system: urethral meatus normal and bladder without fullness, nontender Vaginal: normal without tenderness, induration or masses, + chunky white discharge Cervix: normal appearance Adnexa: normal bimanual exam Uterus: retroverted and non-tender, normal size   Lab Review Urine pregnancy test Labs reviewed yes Radiologic studies reviewed yes  50% of 30 min visit spent on counseling and coordination of care.   Assessment:    Healthy female exam.   Vulvovaginitis DM type 2, uncontrolled   Plan:    Education reviewed: depression evaluation, low fat, low cholesterol diet, safe sex/STD prevention, self breast  exams, skin cancer screening and weight bearing exercise. Contraception: condoms. Mammogram ordered. Follow up in: 1 year.   Meds ordered this encounter  Medications  . fluconazole (DIFLUCAN) 100 MG tablet    Sig: Take 1 tablet (100 mg total) by mouth once. Repeat dose in 48-72 hours.    Dispense:  3 tablet    Refill:  2   Orders Placed This Encounter  Procedures  . SureSwab, Vaginosis/Vaginitis Plus  . MM DIGITAL SCREENING BILATERAL    Standing Status: Future     Number of Occurrences:      Standing Expiration Date: 07/11/2015    Order Specific Question:  Reason for Exam (SYMPTOM  OR DIAGNOSIS REQUIRED)    Answer:  [id number] well woman    Order Specific Question:  Is the patient pregnant?    Answer:  No    Order Specific Question:  Preferred imaging location?    Answer:  [facility]  . CBC  . HIV antibody (with reflex)  . Hemoglobin A1c  . Comprehensive metabolic panel  . TSH  . Cholesterol, total  . Triglycerides  . HDL cholesterol  . Ambulatory referral to Endocrinology    Referral Priority:  Routine    Referral Type:  Consultation  Referral Reason:  Specialty Services Required    Number of Visits Requested:  1  . Ambulatory referral to Internal Medicine    Referral Priority:  Routine    Referral Type:  Consultation    Referral Reason:  Specialty Services Required    Requested Specialty:  Internal Medicine    Number of Visits Requested:  1  . Referral to Nutrition and Diabetes Services    Referral Priority:  Routine    Referral Type:  Consultation    Referral Reason:  Specialty Services Required    Number of Visits Requested:  1

## 2014-05-12 LAB — HIV ANTIBODY (ROUTINE TESTING W REFLEX): HIV 1&2 Ab, 4th Generation: NONREACTIVE

## 2014-05-15 ENCOUNTER — Ambulatory Visit: Payer: Medicaid Other

## 2014-05-15 LAB — SURESWAB, VAGINOSIS/VAGINITIS PLUS
Atopobium vaginae: NOT DETECTED Log (cells/mL)
C. albicans, DNA: NOT DETECTED
C. glabrata, DNA: NOT DETECTED
C. parapsilosis, DNA: NOT DETECTED
C. trachomatis RNA, TMA: NOT DETECTED
C. tropicalis, DNA: NOT DETECTED
Gardnerella vaginalis: NOT DETECTED Log (cells/mL)
LACTOBACILLUS SPECIES: 7.3 Log (cells/mL)
MEGASPHAERA SPECIES: NOT DETECTED Log (cells/mL)
N. gonorrhoeae RNA, TMA: NOT DETECTED
T. vaginalis RNA, QL TMA: NOT DETECTED

## 2014-05-16 LAB — PAP IG AND HPV HIGH-RISK: HPV DNA HIGH RISK: NOT DETECTED

## 2014-05-17 ENCOUNTER — Other Ambulatory Visit: Payer: Self-pay | Admitting: Obstetrics

## 2014-05-23 ENCOUNTER — Ambulatory Visit: Payer: Medicaid Other

## 2014-05-30 ENCOUNTER — Ambulatory Visit: Payer: Medicaid Other

## 2014-06-06 ENCOUNTER — Ambulatory Visit: Payer: Medicaid Other

## 2014-06-13 ENCOUNTER — Other Ambulatory Visit: Payer: Self-pay | Admitting: Obstetrics

## 2014-08-02 ENCOUNTER — Ambulatory Visit: Payer: Medicaid Other | Admitting: Obstetrics

## 2014-08-02 ENCOUNTER — Ambulatory Visit (INDEPENDENT_AMBULATORY_CARE_PROVIDER_SITE_OTHER): Payer: Medicaid Other | Admitting: Obstetrics

## 2014-08-02 ENCOUNTER — Encounter: Payer: Self-pay | Admitting: Obstetrics

## 2014-08-02 VITALS — BP 145/94 | HR 80 | Temp 98.7°F | Ht 66.0 in | Wt 192.0 lb

## 2014-08-02 DIAGNOSIS — B9689 Other specified bacterial agents as the cause of diseases classified elsewhere: Secondary | ICD-10-CM

## 2014-08-02 DIAGNOSIS — E669 Obesity, unspecified: Secondary | ICD-10-CM | POA: Diagnosis not present

## 2014-08-02 DIAGNOSIS — A499 Bacterial infection, unspecified: Secondary | ICD-10-CM | POA: Diagnosis not present

## 2014-08-02 DIAGNOSIS — B373 Candidiasis of vulva and vagina: Secondary | ICD-10-CM | POA: Diagnosis not present

## 2014-08-02 DIAGNOSIS — N760 Acute vaginitis: Secondary | ICD-10-CM | POA: Diagnosis not present

## 2014-08-02 DIAGNOSIS — B3731 Acute candidiasis of vulva and vagina: Secondary | ICD-10-CM

## 2014-08-02 MED ORDER — CLINDAMYCIN HCL 300 MG PO CAPS: 300.0000 mg | ORAL_CAPSULE | Freq: Three times a day (TID) | ORAL | Status: DC

## 2014-08-02 MED ORDER — PHENTERMINE HCL 37.5 MG PO CAPS: 37.5000 mg | ORAL_CAPSULE | ORAL | Status: DC

## 2014-08-02 MED ORDER — FLUCONAZOLE 150 MG PO TABS: 150.0000 mg | ORAL_TABLET | Freq: Once | ORAL | Status: DC

## 2014-08-02 NOTE — Progress Notes (Signed)
Patient ID: Emily Navarro, female   DOB: Jul 25, 1970, 44 y.o.   MRN: [id number]  Chief Complaint  Patient presents with  . Abdominal Pain    HPI Emily Navarro is a 44 y.o. female.  C/O vaginal pain and malodorous discharge.  Has had recent tummy tuck.  HPI  Past Medical History  Diagnosis Date  . Hypertension   . Bipolar 1 disorder     Past Surgical History  Procedure Laterality Date  . Hysteroscopy    . Abdominoplasty    . Tummy tuck      Family History  Problem Relation Age of Onset  . Thyroid disease Mother   . Diabetes Father   . Hypertension Father   . Cancer Father     Social History History  Substance Use Topics  . Smoking status: Never Smoker   . Smokeless tobacco: Never Used  . Alcohol Use: No    No Known Allergies  Current Outpatient Prescriptions  Medication Sig Dispense Refill  . glucose blood test strip Use as instructed . Patient uses Sure to go meter 100 each 12  . lisinopril (PRINIVIL,ZESTRIL) 5 MG tablet Take 5 mg by mouth daily.    . metFORMIN (GLUCOPHAGE XR) 500 MG 24 hr tablet Take 1 tablet (500 mg total) by mouth 2 (two) times daily. 60 tablet 11  . triamterene-hydrochlorothiazide (MAXZIDE-25) 37.5-25 MG per tablet Take 1 tablet by mouth daily. 30 tablet 11  . [facility] THIN LANCETS MISC 60 Sticks by Does not apply route 2 (two) times daily. Patient needs lancets to go with sure to go lancet device. If patient does not have you may fill lancet device and lancets generic brand. Patient to check CBG bid. 100 each 0  . clindamycin (CLEOCIN) 300 MG capsule Take 1 capsule (300 mg total) by mouth 3 (three) times daily. 21 capsule 2  . fluconazole (DIFLUCAN) 150 MG tablet Take 1 tablet (150 mg total) by mouth once. 1 tablet 2  . phentermine 37.5 MG capsule Take 1 capsule (37.5 mg total) by mouth every morning. 30 capsule 2   No current facility-administered medications for this visit.    Review of Systems Review of Systems Constitutional:  negative for fatigue and weight loss Respiratory: negative for cough and wheezing Cardiovascular: negative for chest pain, fatigue and palpitations Gastrointestinal: negative for abdominal pain and change in bowel habits Genitourinary: vaginal pain and malodorous discharge Integument/breast: negative for nipple discharge Musculoskeletal:negative for myalgias Neurological: negative for gait problems and tremors Behavioral/Psych: negative for abusive relationship, depression Endocrine: negative for temperature intolerance     Blood pressure 145/94, pulse 80, temperature 98.7 F (37.1 C), height 5\' 6"  (1.676 m), weight 192 lb (87.091 kg), last menstrual period 07/27/2014.  Physical Exam Physical Exam General:   alert  Skin:   no rash or abnormalities  Lungs:   clear to auscultation bilaterally  Heart:   regular rate and rhythm, S1, S2 normal, no murmur, click, rub or gallop  Breasts:   normal without suspicious masses, skin or nipple changes or axillary nodes  Abdomen:  normal findings: no organomegaly, soft, non-tender and no hernia  Pelvis:  External genitalia: normal general appearance Urinary system: urethral meatus normal and bladder without fullness, nontender Vaginal: normal without tenderness or lesions.  Menstrual blood in vault. Cervix: normal appearance Adnexa: normal bimanual exam Uterus: anteverted and non-tender, normal size      Data Reviewed Labs  Assessment     BV Obesity S/P abdominoplasty with post op  normal tenderness     Plan    Clindamycin Rx for BV Diflucan Rx for yeast coverage F/U prn.  No orders of the defined types were placed in this encounter.   Meds ordered this encounter  Medications  . lisinopril (PRINIVIL,ZESTRIL) 5 MG tablet    Sig: Take 5 mg by mouth daily.  . phentermine 37.5 MG capsule    Sig: Take 1 capsule (37.5 mg total) by mouth every morning.    Dispense:  30 capsule    Refill:  2  . clindamycin (CLEOCIN) 300 MG capsule     Sig: Take 1 capsule (300 mg total) by mouth 3 (three) times daily.    Dispense:  21 capsule    Refill:  2  . fluconazole (DIFLUCAN) 150 MG tablet    Sig: Take 1 tablet (150 mg total) by mouth once.    Dispense:  1 tablet    Refill:  2

## 2014-11-03 ENCOUNTER — Other Ambulatory Visit: Payer: Self-pay | Admitting: Obstetrics

## 2015-07-04 ENCOUNTER — Ambulatory Visit (INDEPENDENT_AMBULATORY_CARE_PROVIDER_SITE_OTHER): Payer: Medicaid Other | Admitting: Obstetrics

## 2015-07-04 ENCOUNTER — Encounter: Payer: Self-pay | Admitting: Obstetrics

## 2015-07-04 VITALS — BP 119/73 | HR 79 | Wt 189.0 lb

## 2015-07-04 DIAGNOSIS — Z01419 Encounter for gynecological examination (general) (routine) without abnormal findings: Secondary | ICD-10-CM | POA: Diagnosis not present

## 2015-07-04 DIAGNOSIS — Z0000 Encounter for general adult medical examination without abnormal findings: Secondary | ICD-10-CM | POA: Diagnosis not present

## 2015-07-04 DIAGNOSIS — E669 Obesity, unspecified: Secondary | ICD-10-CM

## 2015-07-04 DIAGNOSIS — I10 Essential (primary) hypertension: Secondary | ICD-10-CM

## 2015-07-04 DIAGNOSIS — E1159 Type 2 diabetes mellitus with other circulatory complications: Secondary | ICD-10-CM

## 2015-07-04 DIAGNOSIS — Z1389 Encounter for screening for other disorder: Secondary | ICD-10-CM | POA: Diagnosis not present

## 2015-07-04 LAB — POCT URINALYSIS DIPSTICK
BILIRUBIN UA: NEGATIVE
Blood, UA: NEGATIVE
GLUCOSE UA: 250
KETONES UA: NEGATIVE
LEUKOCYTES UA: NEGATIVE
Nitrite, UA: NEGATIVE
Protein, UA: NEGATIVE
SPEC GRAV UA: 1.02
Urobilinogen, UA: NEGATIVE
pH, UA: 6

## 2015-07-04 MED ORDER — PHENTERMINE HCL 37.5 MG PO CAPS: 37.5000 mg | ORAL_CAPSULE | ORAL | Status: DC

## 2015-07-04 NOTE — Progress Notes (Signed)
Subjective:        Emily Navarro is a 45 y.o. female here for a routine exam.  Current complaints: Frustrated over inability to maintain normal weight.    Personal health questionnaire:  Is patient [name], have a family history of breast and/or ovarian cancer: no Is there a family history of uterine cancer diagnosed at age < 10, gastrointestinal cancer, urinary tract cancer, family member who is a Field seismologist syndrome-associated carrier: no Is the patient overweight and hypertensive, family history of diabetes, personal history of gestational diabetes, preeclampsia or PCOS: no Is patient over 66, have PCOS,  family history of premature CHD under age 17, diabetes, smoke, have hypertension or peripheral artery disease:  no At any time, has a partner hit, kicked or otherwise hurt or frightened you?: no Over the past 2 weeks, have you felt down, depressed or hopeless?: no Over the past 2 weeks, have you felt little interest or pleasure in doing things?:no   Gynecologic History No LMP recorded. Contraception: condoms Last Pap: 2016. Results were: abnormal ( ASCUS, with negative High Risk HPV ) Last mammogram: 2012. Results were: normal  Obstetric History OB History  No data available    Past Medical History  Diagnosis Date  . Hypertension   . Bipolar 1 disorder [facility])     Past Surgical History  Procedure Laterality Date  . Hysteroscopy    . Abdominoplasty    . Tummy tuck       Current outpatient prescriptions:  .  glucose blood test strip, Use as instructed . Patient uses Sure to go meter, Disp: 100 each, Rfl: 12 .  lisinopril (PRINIVIL,ZESTRIL) 5 MG tablet, Take 5 mg by mouth daily., Disp: , Rfl:  .  metFORMIN (GLUCOPHAGE XR) 500 MG 24 hr tablet, Take 1 tablet (500 mg total) by mouth 2 (two) times daily., Disp: 60 tablet, Rfl: 11 .  triamterene-hydrochlorothiazide (MAXZIDE-25) 37.5-25 MG per tablet, Take 1 tablet by mouth daily., Disp: 30 tablet, Rfl: 11 .   [facility] THIN LANCETS MISC, 60 Sticks by Does not apply route 2 (two) times daily. Patient needs lancets to go with sure to go lancet device. If patient does not have you may fill lancet device and lancets generic brand. Patient to check CBG bid., Disp: 100 each, Rfl: 0 No Known Allergies  Social History  Substance Use Topics  . Smoking status: Never Smoker   . Smokeless tobacco: Never Used  . Alcohol Use: No    Family History  Problem Relation Age of Onset  . Thyroid disease Mother   . Diabetes Father   . Hypertension Father   . Cancer Father       Review of Systems  Constitutional: positive for undesirable weight gain Respiratory: negative for cough and wheezing Cardiovascular: negative for chest pain, fatigue and palpitations Gastrointestinal: negative for abdominal pain and change in bowel habits Musculoskeletal:negative for myalgias Neurological: negative for gait problems and tremors Behavioral/Psych: negative for abusive relationship, depression Endocrine: negative for temperature intolerance   Genitourinary:negative for abnormal menstrual periods, genital lesions, hot flashes, sexual problems and vaginal discharge Integument/breast: negative for breast lump, breast tenderness, nipple discharge and skin lesion(s)    Objective:       BP 119/73 mmHg  Pulse 79  Wt 189 lb (85.73 kg) General:   alert  Skin:   no rash or abnormalities  Lungs:   clear to auscultation bilaterally  Heart:   regular rate and rhythm, S1, S2 normal, no murmur, click,  rub or gallop  Breasts:   normal without suspicious masses, skin or nipple changes or axillary nodes  Abdomen:  normal findings: no organomegaly, soft, non-tender and no hernia  Pelvis:  External genitalia: normal general appearance Urinary system: urethral meatus normal and bladder without fullness, nontender Vaginal: normal without tenderness, induration or masses Cervix: normal appearance Adnexa: normal bimanual  exam Uterus: anteverted and non-tender, normal size   Lab Review Urine pregnancy test Labs reviewed yes Radiologic studies reviewed yes    Assessment:    Healthy female exam.    H/O ASCUS pap, with neg High Risk. HPV  Obesity, unstable.  Wants medical management  Diabetes, Type 2, stable on Metformin XR  Essential HTN, stable on HCTZ   Plan:    Repeat pap yearly  Phentermine Rx for 3 month medical management of obesity, with long term weight maintenance program to follow  Education reviewed: calcium supplements, depression evaluation, low fat, low cholesterol diet, safe sex/STD prevention, self breast exams and weight bearing exercise. Contraception: condoms. Mammogram ordered. Follow up in: 1 year.   No orders of the defined types were placed in this encounter.   No orders of the defined types were placed in this encounter.

## 2015-07-04 NOTE — Addendum Note (Signed)
Addended by: Clarnce Flock E on: 07/04/2015 03:41 PM   Modules accepted: Orders

## 2015-07-04 NOTE — Addendum Note (Signed)
Addended by: Lewie Loron D on: 07/04/2015 05:07 PM   Modules accepted: Orders

## 2015-07-04 NOTE — Addendum Note (Signed)
Addended by: Clarnce Flock E on: 07/04/2015 02:53 PM   Modules accepted: Orders

## 2015-07-06 LAB — URINE CULTURE

## 2015-07-07 LAB — NUSWAB VG, CANDIDA 6SP
CANDIDA GLABRATA, NAA: NEGATIVE
CANDIDA LUSITANIAE, NAA: NEGATIVE
Candida albicans, NAA: NEGATIVE
Candida krusei, NAA: NEGATIVE
Candida parapsilosis, NAA: NEGATIVE
Candida tropicalis, NAA: NEGATIVE
Trich vag by NAA: NEGATIVE

## 2015-07-09 LAB — PAP IG AND HPV HIGH-RISK
HPV, high-risk: NEGATIVE
PAP Smear Comment: 0

## 2015-10-29 ENCOUNTER — Other Ambulatory Visit (HOSPITAL_COMMUNITY)
Admission: RE | Admit: 2015-10-29 | Discharge: 2015-10-29 | Disposition: A | Discharge status: Home / Self Care | Payer: Medicaid Other | Source: Ambulatory Visit | Attending: Obstetrics | Admitting: Obstetrics

## 2015-10-29 ENCOUNTER — Encounter: Payer: Self-pay | Admitting: Obstetrics

## 2015-10-29 ENCOUNTER — Ambulatory Visit (INDEPENDENT_AMBULATORY_CARE_PROVIDER_SITE_OTHER): Payer: Medicaid Other | Admitting: Obstetrics

## 2015-10-29 VITALS — BP 115/79 | HR 91 | Temp 98.1°F | Wt 185.8 lb

## 2015-10-29 DIAGNOSIS — E139 Other specified diabetes mellitus without complications: Secondary | ICD-10-CM

## 2015-10-29 DIAGNOSIS — B3731 Acute candidiasis of vulva and vagina: Secondary | ICD-10-CM

## 2015-10-29 DIAGNOSIS — Z113 Encounter for screening for infections with a predominantly sexual mode of transmission: Secondary | ICD-10-CM | POA: Diagnosis present

## 2015-10-29 DIAGNOSIS — N760 Acute vaginitis: Secondary | ICD-10-CM | POA: Diagnosis present

## 2015-10-29 DIAGNOSIS — E109 Type 1 diabetes mellitus without complications: Secondary | ICD-10-CM

## 2015-10-29 DIAGNOSIS — B373 Candidiasis of vulva and vagina: Secondary | ICD-10-CM

## 2015-10-29 MED ORDER — METFORMIN HCL ER 500 MG PO TB24: 2000.0000 mg | ORAL_TABLET | Freq: Every day | ORAL | 11 refills | Status: DC

## 2015-10-29 MED ORDER — FLUCONAZOLE 200 MG PO TABS: 200.0000 mg | ORAL_TABLET | ORAL | 2 refills | Status: DC

## 2015-10-29 NOTE — Addendum Note (Signed)
Addended by: Valli Glance F on: 10/29/2015 04:40 PM   Modules accepted: Orders

## 2015-10-29 NOTE — Progress Notes (Signed)
Subjective:        Emily Navarro is a 45 y.o. female here for a routine exam.  Current complaints: Itching and irritation at vaginal introitus.  Also had cottage cheese discharge.  Used Monistat without relief.  Type 2 Diabetic. Very concerned that glucose has been elevated, fasting.  Personal health questionnaire:  Is patient [name], have a family history of breast and/or ovarian cancer: no Is there a family history of uterine cancer diagnosed at age < 45, gastrointestinal cancer, urinary tract cancer, family member who is a Field seismologist syndrome-associated carrier: no Is the patient overweight and hypertensive, family history of diabetes, personal history of gestational diabetes, preeclampsia or PCOS: no Is patient over 36, have PCOS,  family history of premature CHD under age 26, diabetes, smoke, have hypertension or peripheral artery disease:  no At any time, has a partner hit, kicked or otherwise hurt or frightened you?: no Over the past 2 weeks, have you felt down, depressed or hopeless?: no Over the past 2 weeks, have you felt little interest or pleasure in doing things?:no   Gynecologic History Patient's last menstrual period was 09/24/2015. Contraception: none Last Pap: 2017. Results were: normal Last mammogram: 2012. Results were: normal  Obstetric History OB History  No data available    Past Medical History:  Diagnosis Date  . Bipolar 1 disorder ([place])   . Hypertension     Past Surgical History:  Procedure Laterality Date  . abdominoplasty    . HYSTEROSCOPY    . tummy tuck       Current Outpatient Prescriptions:  .  clonazePAM (KLONOPIN) 1 MG tablet, Take 1 mg by mouth daily., Disp: , Rfl:  .  glucose blood test strip, Use as instructed . Patient uses Sure to go meter, Disp: 100 each, Rfl: 12 .  ibuprofen (ADVIL,MOTRIN) 800 MG tablet, Take 800 mg by mouth every 8 (eight) hours as needed., Disp: , Rfl:  .  lisinopril (PRINIVIL,ZESTRIL) 5 MG tablet,  Take 5 mg by mouth daily., Disp: , Rfl:  .  metFORMIN (GLUCOPHAGE XR) 500 MG 24 hr tablet, Take 1 tablet (500 mg total) by mouth 2 (two) times daily., Disp: 60 tablet, Rfl: 11 .  triamterene-hydrochlorothiazide (MAXZIDE-25) 37.5-25 MG per tablet, Take 1 tablet by mouth daily., Disp: 30 tablet, Rfl: 11 .  [employer] THIN LANCETS MISC, 60 Sticks by Does not apply route 2 (two) times daily. Patient needs lancets to go with sure to go lancet device. If patient does not have you may fill lancet device and lancets generic brand. Patient to check CBG bid., Disp: 100 each, Rfl: 0 .  zolpidem (AMBIEN) 5 MG tablet, Take 10 mg by mouth at bedtime as needed for sleep (1/2 tablet)., Disp: , Rfl:  No Known Allergies  Social History  Substance Use Topics  . Smoking status: Never Smoker  . Smokeless tobacco: Never Used  . Alcohol use No    Family History  Problem Relation Age of Onset  . Thyroid disease Mother   . Diabetes Father   . Hypertension Father   . Cancer Father       Review of Systems  Constitutional: negative for fatigue and weight loss Respiratory: negative for cough and wheezing Cardiovascular: negative for chest pain, fatigue and palpitations Gastrointestinal: negative for abdominal pain and change in bowel habits Musculoskeletal:negative for myalgias Neurological: negative for gait problems and tremors Behavioral/Psych: negative for abusive relationship, depression Endocrine: negative for temperature intolerance   Genitourinary:negative for abnormal menstrual  periods, genital lesions, hot flashes, sexual problems and vaginal discharge Integument/breast: negative for breast lump, breast tenderness, nipple discharge and skin lesion(s)    Objective:       BP 115/79   Pulse 91   Temp 98.1 F (36.7 C)   Wt 185 lb 12.8 oz (84.3 kg)   LMP 09/24/2015   BMI 29.99 kg/m  General:   alert  Skin:   no rash or abnormalities  Lungs:   clear to auscultation bilaterally  Heart:    regular rate and rhythm, S1, S2 normal, no murmur, click, rub or gallop  Breasts:   normal without suspicious masses, skin or nipple changes or axillary nodes  Abdomen:  normal findings: no organomegaly, soft, non-tender and no hernia  Pelvis:  External genitalia: normal general appearance Urinary system: urethral meatus normal and bladder without fullness, nontender Vaginal: normal without tenderness, induration or masses Cervix: normal appearance Adnexa: normal bimanual exam Uterus: anteverted and non-tender, normal size   Lab Review Urine pregnancy test Labs reviewed yes Radiologic studies reviewed yes  50% of 20 min visit spent on counseling and coordination of care.   Assessment:    Healthy female exam.    Candida vaginitis  Type 2 Diabetes   Plan:    Diflucan Rx  Referred to Internal Medicine for general medical care and Diabetes management  Education reviewed: calcium supplements, depression evaluation, safe sex/STD prevention, self breast exams and weight bearing exercise. Mammogram ordered. Follow up in: 1 year.   Meds ordered this encounter  Medications  . ibuprofen (ADVIL,MOTRIN) 800 MG tablet    Sig: Take 800 mg by mouth every 8 (eight) hours as needed.  . zolpidem (AMBIEN) 5 MG tablet    Sig: Take 10 mg by mouth at bedtime as needed for sleep (1/2 tablet).  . clonazePAM (KLONOPIN) 1 MG tablet    Sig: Take 1 mg by mouth daily.   No orders of the defined types were placed in this encounter.    Patient ID: [name], female   DOB: 1970-12-16, 45 y.o.   MRN: [id number] Patient ID: LASHEIKA BIBEE, female   DOB: 1970/05/15, 45 y.o.   MRN: [id number]

## 2015-10-30 LAB — CERVICOVAGINAL ANCILLARY ONLY
CHLAMYDIA, DNA PROBE: NEGATIVE
NEISSERIA GONORRHEA: NEGATIVE
Trichomonas: NEGATIVE

## 2015-10-31 ENCOUNTER — Telehealth: Payer: Self-pay | Admitting: Obstetrics

## 2015-10-31 NOTE — Telephone Encounter (Signed)
Spoke with patient about referral to internal medicine doctor. Pt have medicaid and will call the provider office [facility]) that is on her card for an appointment.

## 2015-11-05 LAB — CERVICOVAGINAL ANCILLARY ONLY
Bacterial vaginitis: NEGATIVE
CANDIDA VAGINITIS: NEGATIVE

## 2016-04-14 ENCOUNTER — Ambulatory Visit (INDEPENDENT_AMBULATORY_CARE_PROVIDER_SITE_OTHER): Payer: Medicaid Other | Admitting: Obstetrics

## 2016-04-14 ENCOUNTER — Other Ambulatory Visit (HOSPITAL_COMMUNITY)
Admission: RE | Admit: 2016-04-14 | Discharge: 2016-04-14 | Disposition: A | Discharge status: Home / Self Care | Payer: Medicaid Other | Source: Ambulatory Visit | Attending: Obstetrics | Admitting: Obstetrics

## 2016-04-14 ENCOUNTER — Encounter: Payer: Self-pay | Admitting: Obstetrics

## 2016-04-14 VITALS — BP 111/78 | HR 71 | Wt 180.0 lb

## 2016-04-14 DIAGNOSIS — Z1239 Encounter for other screening for malignant neoplasm of breast: Secondary | ICD-10-CM

## 2016-04-14 DIAGNOSIS — N898 Other specified noninflammatory disorders of vagina: Secondary | ICD-10-CM

## 2016-04-14 DIAGNOSIS — E663 Overweight: Secondary | ICD-10-CM

## 2016-04-14 MED ORDER — PHENTERMINE HCL 37.5 MG PO CAPS: 37.5000 mg | ORAL_CAPSULE | ORAL | 2 refills | Status: DC

## 2016-04-14 NOTE — Progress Notes (Signed)
Patient presents for vaginal irritation, drying, itching

## 2016-04-14 NOTE — Progress Notes (Signed)
Patient ID: Emily Navarro, female   DOB: October 26, 1970, 46 y.o.   MRN: [id number]  Chief Complaint  Patient presents with  . Vaginal Itching    vaginal irritation, drying & itching    HPI Emily Navarro is a 46 y.o. female.  Vaginal dryness, irritation and itching. HPI  Past Medical History:  Diagnosis Date  . Bipolar 1 disorder ([place])   . Hypertension     Past Surgical History:  Procedure Laterality Date  . abdominoplasty    . HYSTEROSCOPY    . tummy tuck      Family History  Problem Relation Age of Onset  . Thyroid disease Mother   . Diabetes Father   . Hypertension Father   . Cancer Father     Social History Social History  Substance Use Topics  . Smoking status: Never Smoker  . Smokeless tobacco: Never Used  . Alcohol use No    No Known Allergies  Current Outpatient Prescriptions  Medication Sig Dispense Refill  . canagliflozin (INVOKANA) 100 MG TABS tablet Take by mouth daily before breakfast.    . clonazePAM (KLONOPIN) 1 MG tablet Take 1 mg by mouth daily.    . fluconazole (DIFLUCAN) 200 MG tablet Take 1 tablet (200 mg total) by mouth every other day. 3 tablet 2  . glucose blood test strip Use as instructed . Patient uses Sure to go meter 100 each 12  . ibuprofen (ADVIL,MOTRIN) 800 MG tablet Take 800 mg by mouth every 8 (eight) hours as needed.    Marland Kitchen lisinopril (PRINIVIL,ZESTRIL) 5 MG tablet Take 5 mg by mouth daily.    . metFORMIN (GLUCOPHAGE XR) 500 MG 24 hr tablet Take 4 tablets (2,000 mg total) by mouth daily with supper. 120 tablet 11  . phentermine 37.5 MG capsule Take 1 capsule (37.5 mg total) by mouth every morning. 30 capsule 2  . triamterene-hydrochlorothiazide (MAXZIDE-25) 37.5-25 MG per tablet Take 1 tablet by mouth daily. 30 tablet 11  . [employer] THIN LANCETS MISC 60 Sticks by Does not apply route 2 (two) times daily. Patient needs lancets to go with sure to go lancet device. If patient does not have you may fill lancet device and lancets  generic brand. Patient to check CBG bid. 100 each 0  . zolpidem (AMBIEN) 5 MG tablet Take 10 mg by mouth at bedtime as needed for sleep (1/2 tablet).     No current facility-administered medications for this visit.     Review of Systems Review of Systems Constitutional: negative for fatigue and weight loss Respiratory: negative for cough and wheezing Cardiovascular: negative for chest pain, fatigue and palpitations Gastrointestinal: negative for abdominal pain and change in bowel habits Genitourinary:positive for vaginal irritation, dryness and itching Integument/breast: negative for nipple discharge Musculoskeletal:negative for myalgias Neurological: negative for gait problems and tremors Behavioral/Psych: negative for abusive relationship, depression Endocrine: negative for temperature intolerance      Blood pressure 111/78, pulse 71, weight 180 lb (81.6 kg), last menstrual period 03/08/2016.  Physical Exam Physical Exam           General:  Alert and no distress Abdomen:  normal findings: no organomegaly, soft, non-tender and no hernia  Pelvis:  External genitalia: normal general appearance Urinary system: urethral meatus normal and bladder without fullness, nontender Vaginal: normal without tenderness, induration or masses Cervix: normal appearance Adnexa: normal bimanual exam Uterus: anteverted and non-tender, normal size    50% of 20 min visit spent on counseling and coordination of care.  Data Reviewed Labs Mammogram  Assessment     Vaginal irritation, dryness and itching.  Probable Candida vaginitis.    Plan    Wet prep and cultures done Diflucan Rx F/U in 3 months for annual  Orders Placed This Encounter  Procedures  . MM Digital Screening    MEDICAID PF BCG:07/29/2010 LS AND Junious Silk  [phone]               2D    Standing Status:   Future    Standing Expiration Date:   06/14/2017    Order Specific Question:   Reason for Exam (SYMPTOM  OR DIAGNOSIS  REQUIRED)    Answer:   Screening    Order Specific Question:   Is the patient pregnant?    Answer:   No    Order Specific Question:   Preferred imaging location?    Answer:   [facility]   Meds ordered this encounter  Medications  . canagliflozin (INVOKANA) 100 MG TABS tablet    Sig: Take by mouth daily before breakfast.  . phentermine 37.5 MG capsule    Sig: Take 1 capsule (37.5 mg total) by mouth every morning.    Dispense:  30 capsule    Refill:  2

## 2016-04-15 LAB — CERVICOVAGINAL ANCILLARY ONLY
BACTERIAL VAGINITIS: NEGATIVE
Candida vaginitis: POSITIVE — AB
Chlamydia: NEGATIVE
NEISSERIA GONORRHEA: NEGATIVE
Trichomonas: NEGATIVE

## 2016-04-16 ENCOUNTER — Other Ambulatory Visit: Payer: Self-pay | Admitting: Obstetrics

## 2016-04-16 ENCOUNTER — Other Ambulatory Visit: Payer: Self-pay | Admitting: *Deleted

## 2016-04-16 DIAGNOSIS — B3731 Acute candidiasis of vulva and vagina: Secondary | ICD-10-CM

## 2016-04-16 DIAGNOSIS — B373 Candidiasis of vulva and vagina: Secondary | ICD-10-CM

## 2016-04-16 MED ORDER — FLUCONAZOLE 200 MG PO TABS: 200.0000 mg | ORAL_TABLET | ORAL | 2 refills | Status: DC

## 2016-04-16 NOTE — Progress Notes (Signed)
Transmission of Rx for Diflucan previously failed., Rx resent.

## 2016-05-02 ENCOUNTER — Ambulatory Visit
Admission: RE | Admit: 2016-05-02 | Discharge: 2016-05-02 | Disposition: A | Discharge status: Home / Self Care | Payer: Medicaid Other | Source: Ambulatory Visit | Attending: Obstetrics | Admitting: Obstetrics

## 2016-05-02 DIAGNOSIS — Z1239 Encounter for other screening for malignant neoplasm of breast: Secondary | ICD-10-CM

## 2016-05-05 ENCOUNTER — Other Ambulatory Visit: Payer: Self-pay | Admitting: Obstetrics

## 2016-05-05 DIAGNOSIS — R928 Other abnormal and inconclusive findings on diagnostic imaging of breast: Secondary | ICD-10-CM

## 2016-05-07 ENCOUNTER — Ambulatory Visit
Admission: RE | Admit: 2016-05-07 | Discharge: 2016-05-07 | Disposition: A | Discharge status: Home / Self Care | Payer: Medicaid Other | Source: Ambulatory Visit | Attending: Obstetrics | Admitting: Obstetrics

## 2016-05-07 DIAGNOSIS — R928 Other abnormal and inconclusive findings on diagnostic imaging of breast: Secondary | ICD-10-CM

## 2016-05-09 ENCOUNTER — Other Ambulatory Visit: Payer: Medicaid Other

## 2016-06-16 ENCOUNTER — Other Ambulatory Visit (HOSPITAL_COMMUNITY)
Admission: RE | Admit: 2016-06-16 | Discharge: 2016-06-16 | Disposition: A | Discharge status: Home / Self Care | Payer: Medicaid Other | Source: Ambulatory Visit | Attending: Obstetrics | Admitting: Obstetrics

## 2016-06-16 ENCOUNTER — Encounter: Payer: Self-pay | Admitting: Obstetrics

## 2016-06-16 ENCOUNTER — Ambulatory Visit (INDEPENDENT_AMBULATORY_CARE_PROVIDER_SITE_OTHER): Payer: Medicaid Other | Admitting: Obstetrics

## 2016-06-16 VITALS — BP 131/87 | HR 92 | Wt 180.7 lb

## 2016-06-16 DIAGNOSIS — N898 Other specified noninflammatory disorders of vagina: Secondary | ICD-10-CM | POA: Diagnosis present

## 2016-06-16 DIAGNOSIS — B373 Candidiasis of vulva and vagina: Secondary | ICD-10-CM | POA: Diagnosis not present

## 2016-06-16 MED ORDER — TERCONAZOLE 0.4 % VA CREA: 1.0000 | TOPICAL_CREAM | Freq: Every day | VAGINAL | 0 refills | Status: DC

## 2016-06-16 NOTE — Addendum Note (Signed)
Addended by: Tamela Oddi on: 06/16/2016 05:16 PM   Modules accepted: Orders

## 2016-06-16 NOTE — Progress Notes (Signed)
Patient presents for vaginal itching and burning after rough sex, denies discharge and odor, and thinks she has a tear.

## 2016-06-16 NOTE — Progress Notes (Signed)
Patient ID: Emily Navarro, female   DOB: 08-23-1970, 46 y.o.   MRN: [id number]  Chief Complaint  Patient presents with  . Gynecologic Exam    vaginal irritation    HPI Emily Navarro is a 46 y.o. female.  Vaginal itching and burning after rough sex.   HPI  Past Medical History:  Diagnosis Date  . Bipolar 1 disorder ([place])   . Hypertension     Past Surgical History:  Procedure Laterality Date  . abdominoplasty    . HYSTEROSCOPY    . tummy tuck      Family History  Problem Relation Age of Onset  . Thyroid disease Mother   . Diabetes Father   . Hypertension Father   . Cancer Father     Social History Social History  Substance Use Topics  . Smoking status: Never Smoker  . Smokeless tobacco: Never Used  . Alcohol use No    No Known Allergies  Current Outpatient Prescriptions  Medication Sig Dispense Refill  . canagliflozin (INVOKANA) 100 MG TABS tablet Take by mouth daily before breakfast.    . clonazePAM (KLONOPIN) 1 MG tablet Take 1 mg by mouth daily.    Marland Kitchen glucose blood test strip Use as instructed . Patient uses Sure to go meter 100 each 12  . ibuprofen (ADVIL,MOTRIN) 800 MG tablet Take 800 mg by mouth every 8 (eight) hours as needed.    Marland Kitchen lisinopril (PRINIVIL,ZESTRIL) 5 MG tablet Take 5 mg by mouth daily.    . metFORMIN (GLUCOPHAGE XR) 500 MG 24 hr tablet Take 4 tablets (2,000 mg total) by mouth daily with supper. 120 tablet 11  . phentermine 37.5 MG capsule Take 1 capsule (37.5 mg total) by mouth every morning. 30 capsule 2  . triamterene-hydrochlorothiazide (MAXZIDE-25) 37.5-25 MG per tablet Take 1 tablet by mouth daily. 30 tablet 11  . [employer] THIN LANCETS MISC 60 Sticks by Does not apply route 2 (two) times daily. Patient needs lancets to go with sure to go lancet device. If patient does not have you may fill lancet device and lancets generic brand. Patient to check CBG bid. 100 each 0  . zolpidem (AMBIEN) 5 MG tablet Take 10 mg by mouth at bedtime as  needed for sleep (1/2 tablet).    . fluconazole (DIFLUCAN) 200 MG tablet Take 1 tablet (200 mg total) by mouth every other day. (Patient not taking: Reported on 06/16/2016) 3 tablet 2  . terconazole (TERAZOL 7) 0.4 % vaginal cream Place 1 applicator vaginally at bedtime. 45 g 0   No current facility-administered medications for this visit.     Review of Systems Review of Systems Constitutional: negative for fatigue and weight loss Respiratory: negative for cough and wheezing Cardiovascular: negative for chest pain, fatigue and palpitations Gastrointestinal: negative for abdominal pain and change in bowel habits Genitourinary:positive for vaginal itching and burning Integument/breast: negative for nipple discharge Musculoskeletal:negative for myalgias Neurological: negative for gait problems and tremors Behavioral/Psych: negative for abusive relationship, depression Endocrine: negative for temperature intolerance      Blood pressure 131/87, pulse 92, weight 180 lb 11.2 oz (82 kg), last menstrual period 05/26/2016.  Physical Exam Physical Exam Pelvis:  External genitalia: normal general appearance Urinary system: urethral meatus normal and bladder without fullness, nontender Vaginal: normal without tenderness, induration or masses Cervix: normal appearance Adnexa: normal bimanual exam Uterus: anteverted and non-tender, normal size    50% of 15 min visit spent on counseling and coordination of care.  Data Reviewed Wet prep  Assessment     Vaginal Itching and Burning Normal Pelvic Exam    Plan    Follow up prn  No orders of the defined types were placed in this encounter.  Meds ordered this encounter  Medications  . terconazole (TERAZOL 7) 0.4 % vaginal cream    Sig: Place 1 applicator vaginally at bedtime.    Dispense:  45 g    Refill:  0

## 2016-06-18 LAB — CERVICOVAGINAL ANCILLARY ONLY
Bacterial vaginitis: NEGATIVE
CHLAMYDIA, DNA PROBE: NEGATIVE
Candida vaginitis: POSITIVE — AB
Neisseria Gonorrhea: NEGATIVE
Trichomonas: NEGATIVE

## 2016-06-19 ENCOUNTER — Other Ambulatory Visit: Payer: Self-pay | Admitting: Obstetrics

## 2016-07-07 ENCOUNTER — Ambulatory Visit: Payer: Self-pay | Admitting: Obstetrics

## 2016-08-04 ENCOUNTER — Ambulatory Visit: Payer: Medicaid Other | Admitting: Obstetrics

## 2016-09-29 ENCOUNTER — Ambulatory Visit: Payer: Self-pay | Admitting: Obstetrics

## 2016-10-15 ENCOUNTER — Ambulatory Visit: Payer: Self-pay | Admitting: Obstetrics

## 2016-10-27 ENCOUNTER — Ambulatory Visit: Payer: Self-pay | Admitting: Obstetrics

## 2016-11-17 ENCOUNTER — Ambulatory Visit: Payer: Self-pay | Admitting: Obstetrics

## 2016-12-02 ENCOUNTER — Ambulatory Visit: Payer: Medicaid Other | Admitting: Obstetrics

## 2016-12-02 ENCOUNTER — Encounter: Payer: Self-pay | Admitting: Obstetrics

## 2016-12-02 ENCOUNTER — Other Ambulatory Visit (HOSPITAL_COMMUNITY)
Admission: RE | Admit: 2016-12-02 | Discharge: 2016-12-02 | Disposition: A | Discharge status: Home / Self Care | Payer: Medicaid Other | Source: Ambulatory Visit | Attending: Obstetrics | Admitting: Obstetrics

## 2016-12-02 VITALS — BP 114/76 | HR 91 | Ht 65.0 in | Wt 181.6 lb

## 2016-12-02 DIAGNOSIS — R102 Pelvic and perineal pain: Secondary | ICD-10-CM | POA: Insufficient documentation

## 2016-12-02 DIAGNOSIS — Z01419 Encounter for gynecological examination (general) (routine) without abnormal findings: Secondary | ICD-10-CM | POA: Diagnosis not present

## 2016-12-02 DIAGNOSIS — N898 Other specified noninflammatory disorders of vagina: Secondary | ICD-10-CM

## 2016-12-02 DIAGNOSIS — Z0000 Encounter for general adult medical examination without abnormal findings: Secondary | ICD-10-CM | POA: Diagnosis not present

## 2016-12-02 DIAGNOSIS — I10 Essential (primary) hypertension: Secondary | ICD-10-CM

## 2016-12-02 DIAGNOSIS — E663 Overweight: Secondary | ICD-10-CM

## 2016-12-02 DIAGNOSIS — Z3202 Encounter for pregnancy test, result negative: Secondary | ICD-10-CM

## 2016-12-02 DIAGNOSIS — E139 Other specified diabetes mellitus without complications: Secondary | ICD-10-CM

## 2016-12-02 LAB — POCT URINE PREGNANCY: Preg Test, Ur: NEGATIVE

## 2016-12-02 MED ORDER — PHENTERMINE HCL 37.5 MG PO CAPS: 37.5000 mg | ORAL_CAPSULE | ORAL | 2 refills | Status: DC

## 2016-12-02 NOTE — Progress Notes (Signed)
Patient is in the office for annual, last pap 07-04-15.

## 2016-12-02 NOTE — Addendum Note (Signed)
Addended by: Maryruth Eve on: 12/02/2016 05:16 PM   Modules accepted: Orders

## 2016-12-02 NOTE — Progress Notes (Signed)
Subjective:        Emily Navarro is a 46 y.o. female here for a routine exam.  Current complaints: " Bump " around anal area, healing.    Personal health questionnaire:  Is patient [name], have a family history of breast and/or ovarian cancer: no Is there a family history of uterine cancer diagnosed at age < 42, gastrointestinal cancer, urinary tract cancer, family member who is a Field seismologist syndrome-associated carrier: no Is the patient overweight and hypertensive, family history of diabetes, personal history of gestational diabetes, preeclampsia or PCOS: no Is patient over 37, have PCOS,  family history of premature CHD under age 48, diabetes, smoke, have hypertension or peripheral artery disease:  no At any time, has a partner hit, kicked or otherwise hurt or frightened you?: no Over the past 2 weeks, have you felt down, depressed or hopeless?: no Over the past 2 weeks, have you felt little interest or pleasure in doing things?:no   Gynecologic History Patient's last menstrual period was 10/30/2016. Contraception: condoms Last Pap: 2016. Results were: normal Last mammogram: 2018. Results were: normal  Obstetric History OB History  Gravida Para Term Preterm AB Living  8 3 3   5 3   SAB TAB Ectopic Multiple Live Births  2 3     3     # Outcome Date GA Lbr Len/2nd Weight Sex Delivery Anes PTL Lv  8 Term 01/09/09    M Vag-Spont   LIV  7 Term 01/03/93    F Vag-Spont   LIV  6 Term 04/05/89    M Vag-Spont   LIV  5 TAB           4 TAB           3 TAB           2 SAB           1 SAB               Past Medical History:  Diagnosis Date  . Bipolar 1 disorder ([place])   . Hypertension     Past Surgical History:  Procedure Laterality Date  . abdominoplasty    . HYSTEROSCOPY    . tummy tuck       Current Outpatient Medications:  .  canagliflozin (INVOKANA) 100 MG TABS tablet, Take by mouth daily before breakfast., Disp: , Rfl:  .  clonazePAM (KLONOPIN) 1 MG tablet,  Take 1 mg by mouth daily., Disp: , Rfl:  .  glucose blood test strip, Use as instructed . Patient uses Sure to go meter, Disp: 100 each, Rfl: 12 .  ibuprofen (ADVIL,MOTRIN) 800 MG tablet, Take 800 mg by mouth every 8 (eight) hours as needed., Disp: , Rfl:  .  lisinopril (PRINIVIL,ZESTRIL) 5 MG tablet, Take 5 mg by mouth daily., Disp: , Rfl:  .  metFORMIN (GLUCOPHAGE XR) 500 MG 24 hr tablet, Take 4 tablets (2,000 mg total) by mouth daily with supper., Disp: 120 tablet, Rfl: 11 .  triamterene-hydrochlorothiazide (MAXZIDE-25) 37.5-25 MG per tablet, Take 1 tablet by mouth daily., Disp: 30 tablet, Rfl: 11 .  [employer] TH[employer] LANCETS MISC, 60 Sticks by Does not apply route 2 (two) times daily. Patient needs lancets to go with sure to go lancet device. If patient does not have you may fill lancet device and lancets generic brand. Patient to check CBG bid., Disp: 100 each, Rfl: 0 .  zolpidem (AMBIEN) 5 MG tablet, Take 10 mg by mouth at bedtime  as needed for sleep (1/2 tablet)., Disp: , Rfl:  .  fluconazole (DIFLUCAN) 200 MG tablet, Take 1 tablet (200 mg total) by mouth every other day. (Patient not taking: Reported on 06/16/2016), Disp: 3 tablet, Rfl: 2 .  phentermine 37.5 MG capsule, Take 1 capsule (37.5 mg total) by mouth every morning. (Patient not taking: Reported on 12/02/2016), Disp: 30 capsule, Rfl: 2 .  terconazole (TERAZOL 7) 0.4 % vaginal cream, Place 1 applicator vaginally at bedtime. (Patient not taking: Reported on 12/02/2016), Disp: 45 g, Rfl: 0 No Known Allergies  Social History   Tobacco Use  . Smoking status: Never Smoker  . Smokeless tobacco: Never Used  Substance Use Topics  . Alcohol use: No    Alcohol/week: 0.0 oz    Family History  Problem Relation Age of Onset  . Thyroid disease Mother   . Diabetes Father   . Hypertension Father   . Cancer Father   . Breast cancer Paternal Grandmother       Review of Systems  Constitutional: negative for fatigue and weight  loss Respiratory: negative for cough and wheezing Cardiovascular: negative for chest pain, fatigue and palpitations Gastrointestinal: negative for abdominal pain and change in bowel habits Musculoskeletal:negative for myalgias Neurological: negative for gait problems and tremors Behavioral/Psych: negative for abusive relationship, depression Endocrine: negative for temperature intolerance    Genitourinary:negative for abnormal menstrual periods, genital lesions, hot flashes, sexual problems and vaginal discharge Integument/breast: negative for breast lump, breast tenderness, nipple discharge and skin lesion(s)    Objective:       BP 114/76   Pulse 91   Ht 5\' 5"  (1.651 m)   Wt 181 lb 9.6 oz (82.4 kg)   LMP 10/30/2016   BMI 30.22 kg/m  General:   alert  Skin:   no rash or abnormalities  Lungs:   clear to auscultation bilaterally  Heart:   regular rate and rhythm, S1, S2 normal, no murmur, click, rub or gallop  Breasts:   normal without suspicious masses, skin or nipple changes or axillary nodes  Abdomen:  normal findings: no organomegaly, soft, non-tender and no hernia  Pelvis:  External genitalia: normal general appearance Urinary system: urethral meatus normal and bladder without fullness, nontender Vaginal: normal without tenderness, induration or masses Cervix: normal appearance Adnexa: normal bimanual exam Uterus: anteverted and non-tender, normal size   Lab Review Urine pregnancy test Labs reviewed yes Radiologic studies reviewed yes  50% of 20 min visit spent on counseling and coordination of care.   Assessment:     1. Encounter for gynecological examination with Papanicolaou smear of cervix Rx: - Cytology - PAP  2. Essential hypertension - stable.  Followed by PCP  3. Pelvic pain in female Rx: - US PELVIC COMPLETE WITH TRANSVAGINAL; Future  4. 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  Followed by PCP.  5. Vaginal discharge Rx: - Cervicovaginal  ancillary only   Plan:    Education reviewed: calcium supplements, depression evaluation, low fat, low cholesterol diet, safe sex/STD prevention, self breast exams and weight bearing exercise. Contraception: condoms. Follow up in: 2 years.   No orders of the defined types were placed in this encounter.  No orders of the defined types were placed in this encounter.

## 2016-12-04 LAB — CERVICOVAGINAL ANCILLARY ONLY
Bacterial vaginitis: NEGATIVE
Candida vaginitis: POSITIVE — AB
Chlamydia: NEGATIVE
NEISSERIA GONORRHEA: NEGATIVE
Trichomonas: NEGATIVE

## 2016-12-05 ENCOUNTER — Telehealth: Payer: Self-pay

## 2016-12-05 ENCOUNTER — Other Ambulatory Visit: Payer: Self-pay | Admitting: Obstetrics

## 2016-12-05 DIAGNOSIS — B373 Candidiasis of vulva and vagina: Secondary | ICD-10-CM

## 2016-12-05 DIAGNOSIS — B3731 Acute candidiasis of vulva and vagina: Secondary | ICD-10-CM

## 2016-12-05 LAB — CYTOLOGY - PAP
DIAGNOSIS: NEGATIVE
HPV: NOT DETECTED

## 2016-12-05 MED ORDER — FLUCONAZOLE 200 MG PO TABS: 200.0000 mg | ORAL_TABLET | ORAL | 2 refills | Status: DC

## 2016-12-05 NOTE — Telephone Encounter (Signed)
-----   Message from Shelly Bombard, MD sent at 12/05/2016  8:17 AM EST ----- Diflucan Rx for yeast.

## 2016-12-05 NOTE — Telephone Encounter (Signed)
Voicemail not set up yet. Unable to leave mess.

## 2016-12-10 ENCOUNTER — Ambulatory Visit (HOSPITAL_COMMUNITY)
Admission: RE | Admit: 2016-12-10 | Discharge: 2016-12-10 | Disposition: A | Discharge status: Home / Self Care | Payer: Medicaid Other | Source: Ambulatory Visit | Attending: Obstetrics | Admitting: Obstetrics

## 2016-12-10 DIAGNOSIS — R102 Pelvic and perineal pain: Secondary | ICD-10-CM | POA: Diagnosis not present

## 2017-02-02 ENCOUNTER — Other Ambulatory Visit: Payer: Self-pay | Admitting: Obstetrics

## 2017-02-02 DIAGNOSIS — E139 Other specified diabetes mellitus without complications: Secondary | ICD-10-CM

## 2017-02-10 DIAGNOSIS — F331 Major depressive disorder, recurrent, moderate: Secondary | ICD-10-CM | POA: Diagnosis not present

## 2017-02-17 DIAGNOSIS — F331 Major depressive disorder, recurrent, moderate: Secondary | ICD-10-CM | POA: Diagnosis not present

## 2017-02-24 DIAGNOSIS — F331 Major depressive disorder, recurrent, moderate: Secondary | ICD-10-CM | POA: Diagnosis not present

## 2017-03-03 DIAGNOSIS — F331 Major depressive disorder, recurrent, moderate: Secondary | ICD-10-CM | POA: Diagnosis not present

## 2017-03-18 DIAGNOSIS — F331 Major depressive disorder, recurrent, moderate: Secondary | ICD-10-CM | POA: Diagnosis not present

## 2017-03-24 DIAGNOSIS — F331 Major depressive disorder, recurrent, moderate: Secondary | ICD-10-CM | POA: Diagnosis not present

## 2017-04-01 DIAGNOSIS — F331 Major depressive disorder, recurrent, moderate: Secondary | ICD-10-CM | POA: Diagnosis not present

## 2017-04-06 DIAGNOSIS — F331 Major depressive disorder, recurrent, moderate: Secondary | ICD-10-CM | POA: Diagnosis not present

## 2017-04-07 DIAGNOSIS — F331 Major depressive disorder, recurrent, moderate: Secondary | ICD-10-CM | POA: Diagnosis not present

## 2017-04-15 DIAGNOSIS — F331 Major depressive disorder, recurrent, moderate: Secondary | ICD-10-CM | POA: Diagnosis not present

## 2017-04-22 DIAGNOSIS — F331 Major depressive disorder, recurrent, moderate: Secondary | ICD-10-CM | POA: Diagnosis not present

## 2017-05-03 ENCOUNTER — Other Ambulatory Visit: Payer: Self-pay | Admitting: Obstetrics

## 2017-05-03 DIAGNOSIS — E663 Overweight: Secondary | ICD-10-CM

## 2017-05-13 DIAGNOSIS — F331 Major depressive disorder, recurrent, moderate: Secondary | ICD-10-CM | POA: Diagnosis not present

## 2017-05-20 DIAGNOSIS — F331 Major depressive disorder, recurrent, moderate: Secondary | ICD-10-CM | POA: Diagnosis not present

## 2017-06-03 DIAGNOSIS — F331 Major depressive disorder, recurrent, moderate: Secondary | ICD-10-CM | POA: Diagnosis not present

## 2017-06-17 DIAGNOSIS — F331 Major depressive disorder, recurrent, moderate: Secondary | ICD-10-CM | POA: Diagnosis not present

## 2017-06-24 DIAGNOSIS — F331 Major depressive disorder, recurrent, moderate: Secondary | ICD-10-CM | POA: Diagnosis not present

## 2017-06-30 ENCOUNTER — Ambulatory Visit: Payer: Medicaid Other | Admitting: Obstetrics

## 2017-06-30 ENCOUNTER — Other Ambulatory Visit: Payer: Self-pay

## 2017-06-30 ENCOUNTER — Encounter: Payer: Self-pay | Admitting: Obstetrics

## 2017-06-30 VITALS — BP 109/71 | HR 84 | Ht 65.0 in | Wt 183.4 lb

## 2017-06-30 DIAGNOSIS — Z6833 Body mass index (BMI) 33.0-33.9, adult: Secondary | ICD-10-CM

## 2017-06-30 DIAGNOSIS — G5601 Carpal tunnel syndrome, right upper limb: Secondary | ICD-10-CM

## 2017-06-30 DIAGNOSIS — M62838 Other muscle spasm: Secondary | ICD-10-CM

## 2017-06-30 DIAGNOSIS — B373 Candidiasis of vulva and vagina: Secondary | ICD-10-CM

## 2017-06-30 DIAGNOSIS — E66811 Obesity, class 1: Secondary | ICD-10-CM

## 2017-06-30 DIAGNOSIS — E6609 Other obesity due to excess calories: Secondary | ICD-10-CM | POA: Diagnosis not present

## 2017-06-30 DIAGNOSIS — R102 Pelvic and perineal pain unspecified side: Secondary | ICD-10-CM

## 2017-06-30 DIAGNOSIS — B3731 Acute candidiasis of vulva and vagina: Secondary | ICD-10-CM

## 2017-06-30 MED ORDER — CYCLOBENZAPRINE HCL 10 MG PO TABS: 10.0000 mg | ORAL_TABLET | Freq: Three times a day (TID) | ORAL | 1 refills | Status: DC | PRN

## 2017-06-30 MED ORDER — PHENTERMINE HCL 37.5 MG PO CAPS: 37.5000 mg | ORAL_CAPSULE | ORAL | 2 refills | Status: DC

## 2017-06-30 MED ORDER — METHOCARBAMOL 500 MG PO TABS: 500.0000 mg | ORAL_TABLET | Freq: Three times a day (TID) | ORAL | 2 refills | Status: DC | PRN

## 2017-06-30 MED ORDER — FLUCONAZOLE 150 MG PO TABS: 150.0000 mg | ORAL_TABLET | Freq: Once | ORAL | 2 refills | Status: AC

## 2017-06-30 NOTE — Progress Notes (Signed)
Patient ID: Emily Navarro, female   DOB: Jan 22, 1970, 47 y.o.   MRN: [id number]  Chief Complaint  Patient presents with  . Gynecologic Exam    HPI Emily Navarro is a 47 y.o. female.  Right-sided pain for 1 month. HPI  Past Medical History:  Diagnosis Date  . Bipolar 1 disorder ([place])   . Hypertension     Past Surgical History:  Procedure Laterality Date  . abdominoplasty    . HYSTEROSCOPY    . tummy tuck      Family History  Problem Relation Age of Onset  . Thyroid disease Mother   . Diabetes Father   . Hypertension Father   . Cancer Father   . Breast cancer Paternal Grandmother     Social History Social History   Tobacco Use  . Smoking status: Never Smoker  . Smokeless tobacco: Never Used  Substance Use Topics  . Alcohol use: No    Alcohol/week: 0.0 oz  . Drug use: No    No Known Allergies  Current Outpatient Medications  Medication Sig Dispense Refill  . canagliflozin (INVOKANA) 100 MG TABS tablet Take by mouth daily before breakfast.    . clonazePAM (KLONOPIN) 1 MG tablet Take 1 mg by mouth daily.    . cyclobenzaprine (FLEXERIL) 10 MG tablet Take 1 tablet (10 mg total) by mouth every 8 (eight) hours as needed for muscle spasms. 30 tablet 1  . fluconazole (DIFLUCAN) 200 MG tablet Take 1 tablet (200 mg total) by mouth every other day. 3 tablet 2  . glucose blood test strip Use as instructed . Patient uses Sure to go meter 100 each 12  . ibuprofen (ADVIL,MOTRIN) 800 MG tablet Take 800 mg by mouth every 8 (eight) hours as needed.    Marland Kitchen lisinopril (PRINIVIL,ZESTRIL) 5 MG tablet Take 5 mg by mouth daily.    . metFORMIN (GLUCOPHAGE-XR) 500 MG 24 hr tablet TAKE 4 TABLETS (2,000 MG TOTAL) BY MOUTH DAILY WITH SUPPER. 120 tablet 11  . methocarbamol (ROBAXIN) 500 MG tablet Take 1 tablet (500 mg total) by mouth every 8 (eight) hours as needed for muscle spasms. 30 tablet 2  . phentermine 37.5 MG capsule TAKE 1 CAPSULE EVERY MORNING 30 capsule 2  . phentermine 37.5  MG capsule Take 1 capsule (37.5 mg total) by mouth every morning. 30 capsule 2  . terconazole (TERAZOL 7) 0.4 % vaginal cream Place 1 applicator vaginally at bedtime. (Patient not taking: Reported on 12/02/2016) 45 g 0  . triamterene-hydrochlorothiazide (MAXZIDE-25) 37.5-25 MG per tablet Take 1 tablet by mouth daily. 30 tablet 11  . [employer] THIN LANCETS MISC 60 Sticks by Does not apply route 2 (two) times daily. Patient needs lancets to go with sure to go lancet device. If patient does not have you may fill lancet device and lancets generic brand. Patient to check CBG bid. 100 each 0  . zolpidem (AMBIEN) 5 MG tablet Take 10 mg by mouth at bedtime as needed for sleep (1/2 tablet).     No current facility-administered medications for this visit.     Review of Systems Review of Systems Constitutional: negative for fatigue and weight loss Respiratory: negative for cough and wheezing Cardiovascular: negative for chest pain, fatigue and palpitations Gastrointestinal: negative for abdominal pain and change in bowel habits Genitourinary:POSITIVE for pelvic pain, RIGHT SIDED Integument/breast: negative for nipple discharge Musculoskeletal:negative for myalgias Neurological: negative for gait problems and tremors Behavioral/Psych: negative for abusive relationship, depression Endocrine: negative for temperature intolerance  Blood pressure 109/71, pulse 84, height 5\' 5"  (1.651 m), weight 183 lb 6.4 oz (83.2 kg), last menstrual period 05/30/2017.  Physical Exam Physical Exam           General:  Alert and no distress Abdomen:  normal findings: no organomegaly, soft, non-tender and no hernia  Pelvis:  External genitalia: normal general appearance Urinary system: urethral meatus normal and bladder without fullness, nontender Vaginal: normal without tenderness, induration or masses Cervix: normal appearance Adnexa: tenderness right adnexa, no masses Uterus: anteverted and non-tender, normal  size    50% of 15 min visit spent on counseling and coordination of care.   Data Reviewed CBC  Assessment     1. Pelvic pain in female Rx: - US PELVIC COMPLETE WITH TRANSVAGINAL; Future  2. Candida vaginitis Rx: - fluconazole (DIFLUCAN) 150 MG tablet; Take 1 tablet (150 mg total) by mouth once for 1 dose.  Dispense: 1 tablet; Refill: 2  3. Muscle spasm Rx: - cyclobenzaprine (FLEXERIL) 10 MG tablet; Take 1 tablet (10 mg total) by mouth every 8 (eight) hours as needed for muscle spasms.  Dispense: 30 tablet; Refill: 1 - methocarbamol (ROBAXIN) 500 MG tablet; Take 1 tablet (500 mg total) by mouth every 8 (eight) hours as needed for muscle spasms.  Dispense: 30 tablet; Refill: 2  4. Carpal tunnel syndrome of right wrist  5. Class 1 obesity due to excess calories without serious comorbidity with body mass index (BMI) of 33.0 to 33.9 in adult Rx: - phentermine 37.5 MG capsule; Take 1 capsule (37.5 mg total) by mouth every morning.  Dispense: 30 capsule; Refill: 2    Plan    Follow up in 2 weeks  Orders Placed This Encounter  Procedures  . US PELVIC COMPLETE WITH TRANSVAGINAL    Standing Status:   Future    Standing Expiration Date:   08/31/2018    Order Specific Question:   Reason for Exam (SYMPTOM  OR DIAGNOSIS REQUIRED)    Answer:   Pelvic pain.    Order Specific Question:   Preferred imaging location?    Answer:   [facility]   Meds ordered this encounter  Medications  . cyclobenzaprine (FLEXERIL) 10 MG tablet    Sig: Take 1 tablet (10 mg total) by mouth every 8 (eight) hours as needed for muscle spasms.    Dispense:  30 tablet    Refill:  1  . fluconazole (DIFLUCAN) 150 MG tablet    Sig: Take 1 tablet (150 mg total) by mouth once for 1 dose.    Dispense:  1 tablet    Refill:  2  . methocarbamol (ROBAXIN) 500 MG tablet    Sig: Take 1 tablet (500 mg total) by mouth every 8 (eight) hours as needed for muscle spasms.    Dispense:  30 tablet    Refill:  2  .  phentermine 37.5 MG capsule    Sig: Take 1 capsule (37.5 mg total) by mouth every morning.    Dispense:  30 capsule    Refill:  2    Shelly Bombard MD 03-02-2017

## 2017-06-30 NOTE — Progress Notes (Signed)
presents for RUQ pain 6/10 x 1 month.  LMP Last Mammogram 05/03/47   Last PAP 11/2717

## 2017-07-01 DIAGNOSIS — F331 Major depressive disorder, recurrent, moderate: Secondary | ICD-10-CM | POA: Diagnosis not present

## 2017-07-06 ENCOUNTER — Ambulatory Visit (HOSPITAL_COMMUNITY): Payer: Medicaid Other

## 2017-07-13 ENCOUNTER — Ambulatory Visit (HOSPITAL_COMMUNITY): Payer: Medicaid Other | Attending: Obstetrics

## 2017-07-14 ENCOUNTER — Ambulatory Visit: Payer: Medicaid Other | Admitting: Obstetrics

## 2017-07-22 DIAGNOSIS — F331 Major depressive disorder, recurrent, moderate: Secondary | ICD-10-CM | POA: Diagnosis not present

## 2017-07-29 DIAGNOSIS — F331 Major depressive disorder, recurrent, moderate: Secondary | ICD-10-CM | POA: Diagnosis not present

## 2017-08-12 DIAGNOSIS — F331 Major depressive disorder, recurrent, moderate: Secondary | ICD-10-CM | POA: Diagnosis not present

## 2017-09-02 DIAGNOSIS — F331 Major depressive disorder, recurrent, moderate: Secondary | ICD-10-CM | POA: Diagnosis not present

## 2017-09-09 DIAGNOSIS — F331 Major depressive disorder, recurrent, moderate: Secondary | ICD-10-CM | POA: Diagnosis not present

## 2017-09-23 DIAGNOSIS — F331 Major depressive disorder, recurrent, moderate: Secondary | ICD-10-CM | POA: Diagnosis not present

## 2017-09-28 DIAGNOSIS — M7989 Other specified soft tissue disorders: Secondary | ICD-10-CM | POA: Diagnosis not present

## 2017-09-28 DIAGNOSIS — M722 Plantar fascial fibromatosis: Secondary | ICD-10-CM | POA: Diagnosis not present

## 2017-10-07 DIAGNOSIS — F331 Major depressive disorder, recurrent, moderate: Secondary | ICD-10-CM | POA: Diagnosis not present

## 2017-10-21 DIAGNOSIS — F331 Major depressive disorder, recurrent, moderate: Secondary | ICD-10-CM | POA: Diagnosis not present

## 2017-10-28 DIAGNOSIS — F331 Major depressive disorder, recurrent, moderate: Secondary | ICD-10-CM | POA: Diagnosis not present

## 2017-11-11 DIAGNOSIS — F331 Major depressive disorder, recurrent, moderate: Secondary | ICD-10-CM | POA: Diagnosis not present

## 2017-11-12 ENCOUNTER — Other Ambulatory Visit: Payer: Self-pay

## 2017-11-12 DIAGNOSIS — B373 Candidiasis of vulva and vagina: Secondary | ICD-10-CM

## 2017-11-12 DIAGNOSIS — B3731 Acute candidiasis of vulva and vagina: Secondary | ICD-10-CM

## 2017-11-12 MED ORDER — FLUCONAZOLE 200 MG PO TABS: 200.0000 mg | ORAL_TABLET | Freq: Once | ORAL | 2 refills | Status: AC

## 2017-11-12 NOTE — Progress Notes (Signed)
Rx sent per protocol.  Pt c/o yeast sx's. Ok to send per Dr.Harper.

## 2017-11-18 ENCOUNTER — Ambulatory Visit: Payer: BLUE CROSS/BLUE SHIELD | Admitting: Obstetrics

## 2017-11-25 DIAGNOSIS — F331 Major depressive disorder, recurrent, moderate: Secondary | ICD-10-CM | POA: Diagnosis not present

## 2017-12-01 ENCOUNTER — Ambulatory Visit: Payer: BLUE CROSS/BLUE SHIELD | Admitting: Obstetrics

## 2017-12-10 DIAGNOSIS — F331 Major depressive disorder, recurrent, moderate: Secondary | ICD-10-CM | POA: Diagnosis not present

## 2017-12-16 DIAGNOSIS — F331 Major depressive disorder, recurrent, moderate: Secondary | ICD-10-CM | POA: Diagnosis not present

## 2017-12-23 DIAGNOSIS — F331 Major depressive disorder, recurrent, moderate: Secondary | ICD-10-CM | POA: Diagnosis not present

## 2018-04-07 ENCOUNTER — Other Ambulatory Visit: Payer: Self-pay | Admitting: Obstetrics

## 2018-04-07 DIAGNOSIS — E139 Other specified diabetes mellitus without complications: Secondary | ICD-10-CM

## 2019-07-23 ENCOUNTER — Other Ambulatory Visit: Payer: Self-pay

## 2019-07-23 ENCOUNTER — Emergency Department (HOSPITAL_BASED_OUTPATIENT_CLINIC_OR_DEPARTMENT_OTHER)
Admission: EM | Admit: 2019-07-23 | Discharge: 2019-07-23 | Disposition: A | Discharge status: Home / Self Care | Payer: Medicaid Other | Attending: Emergency Medicine | Admitting: Emergency Medicine

## 2019-07-23 ENCOUNTER — Encounter (HOSPITAL_BASED_OUTPATIENT_CLINIC_OR_DEPARTMENT_OTHER): Payer: Self-pay | Admitting: *Deleted

## 2019-07-23 DIAGNOSIS — Z7984 Long term (current) use of oral hypoglycemic drugs: Secondary | ICD-10-CM | POA: Insufficient documentation

## 2019-07-23 DIAGNOSIS — E119 Type 2 diabetes mellitus without complications: Secondary | ICD-10-CM | POA: Insufficient documentation

## 2019-07-23 DIAGNOSIS — K0889 Other specified disorders of teeth and supporting structures: Secondary | ICD-10-CM

## 2019-07-23 DIAGNOSIS — I10 Essential (primary) hypertension: Secondary | ICD-10-CM | POA: Insufficient documentation

## 2019-07-23 DIAGNOSIS — Z79899 Other long term (current) drug therapy: Secondary | ICD-10-CM | POA: Insufficient documentation

## 2019-07-23 HISTORY — DX: Type 2 diabetes mellitus without complications: E11.9

## 2019-07-23 MED ORDER — CLINDAMYCIN HCL 150 MG PO CAPS: 150.0000 mg | ORAL_CAPSULE | Freq: Three times a day (TID) | ORAL | 0 refills | Status: DC

## 2019-07-23 MED ORDER — HYDROCODONE-ACETAMINOPHEN 5-325 MG PO TABS: 1.0000 | ORAL_TABLET | Freq: Four times a day (QID) | ORAL | 0 refills | Status: AC | PRN

## 2019-07-23 NOTE — ED Provider Notes (Signed)
[facility] EMERGENCY DEPARTMENT Provider Note   CSN: [id number] Arrival date & time: 07/23/19  1829     History Chief Complaint  Patient presents with  . Dental Pain    Emily Navarro is a 49 y.o. female.  The history is provided by the patient.  Dental Pain Location:  Lower Lower teeth location:  29/RL 2nd bicuspid Quality:  Constant, localized and pulsating Severity:  Severe Onset quality:  Gradual Timing:  Constant Progression:  Worsening (worsening since thursday) Chronicity:  Recurrent Context comment:  Had a partial root canal in January and is to get the rest of the root canal at the end of July and has been on antibiotics from her dentist.  But today it seemed more swollen in that area and she just finished her antibiotic and the pain is persistent Relieved by:  Nothing Worsened by:  Touching, jaw movement and cold food/drink Ineffective treatments:  NSAIDs and topical anesthetic gel Associated symptoms: facial pain, facial swelling and gum swelling   Associated symptoms: no fever, no headaches, no neck pain and no neck swelling   Risk factors comment:  Bipolar, DM, HTN      Past Medical History:  Diagnosis Date  . Bipolar 1 disorder ([place])   . Diabetes mellitus without complication ([place])   . Hypertension     Patient Active Problem List   Diagnosis Date Noted  . Vulvitis 12/13/2013  . Essential hypertension 12/13/2013  . 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 Dysmenorrhea 02/03/2013  . Obesity 07/27/2012  . BV (bacterial vaginosis) 07/27/2012  . Screening breast examination 07/27/2012    Past Surgical History:  Procedure Laterality Date  . abdominoplasty    . HYSTEROSCOPY    . tummy tuck       OB History    Gravida  8   Para  3   Term  3   Preterm      AB  5   Living  3     SAB  2   TAB  3   Ectopic      Multiple      Live Births  3           Family History  Problem Relation Age of Onset  .  Thyroid disease Mother   . Diabetes Father   . Hypertension Father   . Cancer Father   . Breast cancer Paternal Grandmother     Social History   Tobacco Use  . Smoking status: Never Smoker  . Smokeless tobacco: Never Used  Vaping Use  . Vaping Use: Never used  Substance Use Topics  . Alcohol use: No    Alcohol/week: 0.0 standard drinks  . Drug use: No    Home Medications Prior to Admission medications   Medication Sig Start Date End Date Taking? Authorizing Provider  canagliflozin (INVOKANA) 100 MG TABS tablet Take by mouth daily before breakfast.    [provider]  clindamycin (CLEOCIN) 150 MG capsule Take 1 capsule (150 mg total) by mouth 3 (three) times daily. 07/23/19   Blanchie Dessert, MD  clonazePAM (KLONOPIN) 1 MG tablet Take 1 mg by mouth daily.    [provider]  cyclobenzaprine (FLEXERIL) 10 MG tablet Take 1 tablet (10 mg total) by mouth every 8 (eight) hours as needed for muscle spasms. 06/30/17   Shelly Bombard, MD  glucose blood test strip Use as instructed . Patient uses Sure to go meter 12/19/13  Shelly Bombard, MD  HYDROcodone-acetaminophen (NORCO/VICODIN) 5-325 MG tablet Take 1 tablet by mouth every 6 (six) hours as needed for severe pain. 07/23/19   Blanchie Dessert, MD  ibuprofen (ADVIL,MOTRIN) 800 MG tablet Take 800 mg by mouth every 8 (eight) hours as needed.    [provider]  lisinopril (PRINIVIL,ZESTRIL) 5 MG tablet Take 5 mg by mouth daily.    [provider]  metFORMIN (GLUCOPHAGE-XR) 500 MG 24 hr tablet TAKE 4 TABLETS (2,000 MG TOTAL) BY MOUTH DAILY WITH SUPPER. 04/07/18   Shelly Bombard, MD  methocarbamol (ROBAXIN) 500 MG tablet Take 1 tablet (500 mg total) by mouth every 8 (eight) hours as needed for muscle spasms. 06/30/17   Shelly Bombard, MD  phentermine 37.5 MG capsule TAKE 1 CAPSULE EVERY MORNING 05/05/17   Shelly Bombard, MD  phentermine 37.5 MG capsule Take 1 capsule (37.5 mg total) by mouth every  morning. 06/30/17   Shelly Bombard, MD  terconazole (TERAZOL 7) 0.4 % vaginal cream Place 1 applicator vaginally at bedtime. Patient not taking: Reported on 12/02/2016 06/16/16   Shelly Bombard, MD  triamterene-hydrochlorothiazide (MAXZIDE-25) 37.5-25 MG per tablet Take 1 tablet by mouth daily. 12/13/13   Shelly Bombard, MD  [facility] THIN LANCETS MISC 60 Sticks by Does not apply route 2 (two) times daily. Patient needs lancets to go with sure to go lancet device. If patient does not have you may fill lancet device and lancets generic brand. Patient to check CBG bid. 12/19/13   Shelly Bombard, MD  zolpidem (AMBIEN) 5 MG tablet Take 10 mg by mouth at bedtime as needed for sleep (1/2 tablet).    [provider]    Allergies    Patient has no known allergies.  Review of Systems   Review of Systems  Constitutional: Negative for fever.  HENT: Positive for facial swelling.   Musculoskeletal: Negative for neck pain.  Neurological: Negative for headaches.  All other systems reviewed and are negative.   Physical Exam Updated Vital Signs BP 118/83 (BP Location: Left Arm)   Pulse 75   Temp 99.3 F (37.4 C) (Oral)   Resp 18   Ht 5\' 5"  (1.651 m)   Wt 77.1 kg   LMP 07/08/2019   SpO2 98%   BMI 28.29 kg/m   Physical Exam Vitals and nursing note reviewed.  Constitutional:      General: She is not in acute distress.    Appearance: Normal appearance. She is well-developed and normal weight.  HENT:     Head: Normocephalic and atraumatic.     Mouth/Throat:     Pharynx: No oropharyngeal exudate.      Comments: No notable facial swelling Eyes:     Pupils: Pupils are equal, round, and reactive to light.  Cardiovascular:     Rate and Rhythm: Normal rate.  Pulmonary:     Effort: Pulmonary effort is normal.  Musculoskeletal:        General: No tenderness. Normal range of motion.     Comments: No edema  Skin:    General: Skin is warm and dry.     Findings: No rash.    Neurological:     General: No focal deficit present.     Mental Status: She is alert and oriented to person, place, and time. Mental status is at baseline.     Cranial Nerves: No cranial nerve deficit.  Psychiatric:        Mood and Affect: Mood  normal.        Behavior: Behavior normal.        Thought Content: Thought content normal.     ED Results / Procedures / Treatments   Labs (all labs ordered are listed, but only abnormal results are displayed) Labs Reviewed - No data to display  EKG None  Radiology No results found.  Procedures Procedures (including critical care time)  Medications Ordered in ED Medications - No data to display  ED Course  I have reviewed the triage vital signs and the nursing notes.  Pertinent labs & imaging results that were available during my care of the patient were reviewed by me and considered in my medical decision making (see chart for details).    MDM Rules/Calculators/A&P                          Pt with dental caries and facial swelling.  No signs of ludwig's angina or difficulty swallowing and no systemic symptoms. Will treat with clinda and have pt f/u with dentist.  Final Clinical Impression(s) / ED Diagnoses Final diagnoses:  Pain, dental    Rx / DC Orders ED Discharge Orders         Ordered    clindamycin (CLEOCIN) 150 MG capsule  3 times daily     Discontinue  Reprint     07/23/19 1929    HYDROcodone-acetaminophen (NORCO/VICODIN) 5-325 MG tablet  Every 6 hours PRN     Discontinue  Reprint     07/23/19 1929           Blanchie Dessert, MD 07/23/19 2300

## 2019-07-23 NOTE — ED Triage Notes (Signed)
Pt reports Left lower dental pain since last week. States she had a root canal started in January which was never finished. Also reports a dark spot on her lower lip which is new

## 2019-08-16 ENCOUNTER — Encounter: Payer: Self-pay | Admitting: Obstetrics

## 2019-08-16 ENCOUNTER — Other Ambulatory Visit (HOSPITAL_COMMUNITY)
Admission: RE | Admit: 2019-08-16 | Discharge: 2019-08-16 | Disposition: A | Discharge status: Home / Self Care | Payer: Medicaid Other | Source: Ambulatory Visit | Attending: Obstetrics | Admitting: Obstetrics

## 2019-08-16 ENCOUNTER — Ambulatory Visit (INDEPENDENT_AMBULATORY_CARE_PROVIDER_SITE_OTHER): Payer: Medicaid Other | Admitting: Obstetrics

## 2019-08-16 ENCOUNTER — Other Ambulatory Visit: Payer: Self-pay

## 2019-08-16 VITALS — BP 122/77 | HR 73 | Wt 181.6 lb

## 2019-08-16 DIAGNOSIS — Z1211 Encounter for screening for malignant neoplasm of colon: Secondary | ICD-10-CM | POA: Diagnosis not present

## 2019-08-16 DIAGNOSIS — N898 Other specified noninflammatory disorders of vagina: Secondary | ICD-10-CM | POA: Diagnosis present

## 2019-08-16 DIAGNOSIS — Z01419 Encounter for gynecological examination (general) (routine) without abnormal findings: Secondary | ICD-10-CM | POA: Insufficient documentation

## 2019-08-16 DIAGNOSIS — Z113 Encounter for screening for infections with a predominantly sexual mode of transmission: Secondary | ICD-10-CM | POA: Diagnosis not present

## 2019-08-16 DIAGNOSIS — Z1239 Encounter for other screening for malignant neoplasm of breast: Secondary | ICD-10-CM

## 2019-08-16 MED ORDER — TERCONAZOLE 0.8 % VA CREA: 1.0000 | TOPICAL_CREAM | Freq: Every day | VAGINAL | 0 refills | Status: AC

## 2019-08-16 MED ORDER — FLUCONAZOLE 200 MG PO TABS: 200.0000 mg | ORAL_TABLET | ORAL | 2 refills | Status: DC

## 2019-08-16 NOTE — Progress Notes (Signed)
Subjective:        Emily Navarro is a 49 y.o. female here for a routine exam.  Current complaints: Vaginal discharge and irritation.    Personal health questionnaire:  Is patient [name], have a family history of breast and/or ovarian cancer: yes Is there a family history of uterine cancer diagnosed at age < 66, gastrointestinal cancer, urinary tract cancer, family member who is a Field seismologist syndrome-associated carrier: no Is the patient overweight and hypertensive, family history of diabetes, personal history of gestational diabetes, preeclampsia or PCOS: no Is patient over 16, have PCOS,  family history of premature CHD under age 53, diabetes, smoke, have hypertension or peripheral artery disease:  yes At any time, has a partner hit, kicked or otherwise hurt or frightened you?: no Over the past 2 weeks, have you felt down, depressed or hopeless?: no Over the past 2 weeks, have you felt little interest or pleasure in doing things?:no   Gynecologic History Patient's last menstrual period was 08/01/2019. Contraception: none Last Pap: 2018. Results were: normal Last mammogram: 05-07-2016. Results were: normal  Obstetric History OB History  Gravida Para Term Preterm AB Living  8 3 3   5 3   SAB TAB Ectopic Multiple Live Births  2 3     3     # Outcome Date GA Lbr Len/2nd Weight Sex Delivery Anes PTL Lv  8 Term 01/09/09    M Vag-Spont   LIV  7 Term 01/03/93    F Vag-Spont   LIV  6 Term 04/05/89    M Vag-Spont   LIV  5 TAB           4 TAB           3 TAB           2 SAB           1 SAB             Past Medical History:  Diagnosis Date  . Bipolar 1 disorder ([place])   . Diabetes mellitus without complication ([place])   . Hypertension     Past Surgical History:  Procedure Laterality Date  . abdominoplasty    . HYSTEROSCOPY    . tummy tuck       Current Outpatient Medications:  .  canagliflozin (INVOKANA) 100 MG TABS tablet, Take by mouth daily before breakfast.,  Disp: , Rfl:  .  clonazePAM (KLONOPIN) 1 MG tablet, Take 1 mg by mouth daily., Disp: , Rfl:  .  glucose blood test strip, Use as instructed . Patient uses Sure to go meter, Disp: 100 each, Rfl: 12 .  lisinopril (PRINIVIL,ZESTRIL) 5 MG tablet, Take 5 mg by mouth daily., Disp: , Rfl:  .  metFORMIN (GLUCOPHAGE-XR) 500 MG 24 hr tablet, TAKE 4 TABLETS (2,000 MG TOTAL) BY MOUTH DAILY WITH SUPPER., Disp: 120 tablet, Rfl: 11 .  triamterene-hydrochlorothiazide (MAXZIDE-25) 37.5-25 MG per tablet, Take 1 tablet by mouth daily., Disp: 30 tablet, Rfl: 11 .  zolpidem (AMBIEN) 5 MG tablet, Take 10 mg by mouth at bedtime as needed for sleep (1/2 tablet)., Disp: , Rfl:  .  clindamycin (CLEOCIN) 150 MG capsule, Take 1 capsule (150 mg total) by mouth 3 (three) times daily. (Patient not taking: Reported on 08/16/2019), Disp: 21 capsule, Rfl: 0 .  cyclobenzaprine (FLEXERIL) 10 MG tablet, Take 1 tablet (10 mg total) by mouth every 8 (eight) hours as needed for muscle spasms. (Patient not taking: Reported on 08/16/2019), Disp: 30  tablet, Rfl: 1 .  fluconazole (DIFLUCAN) 200 MG tablet, Take 1 tablet (200 mg total) by mouth every 3 (three) days., Disp: 3 tablet, Rfl: 2 .  HYDROcodone-acetaminophen (NORCO/VICODIN) 5-325 MG tablet, Take 1 tablet by mouth every 6 (six) hours as needed for severe pain. (Patient not taking: Reported on 08/16/2019), Disp: 6 tablet, Rfl: 0 .  ibuprofen (ADVIL,MOTRIN) 800 MG tablet, Take 800 mg by mouth every 8 (eight) hours as needed. (Patient not taking: Reported on 08/16/2019), Disp: , Rfl:  .  methocarbamol (ROBAXIN) 500 MG tablet, Take 1 tablet (500 mg total) by mouth every 8 (eight) hours as needed for muscle spasms. (Patient not taking: Reported on 08/16/2019), Disp: 30 tablet, Rfl: 2 .  phentermine 37.5 MG capsule, TAKE 1 CAPSULE EVERY MORNING (Patient not taking: Reported on 08/16/2019), Disp: 30 capsule, Rfl: 2 .  phentermine 37.5 MG capsule, Take 1 capsule (37.5 mg total) by mouth every morning.  (Patient not taking: Reported on 08/16/2019), Disp: 30 capsule, Rfl: 2 .  terconazole (TERAZOL 3) 0.8 % vaginal cream, Place 1 applicator vaginally at bedtime., Disp: 20 g, Rfl: 0 .  terconazole (TERAZOL 7) 0.4 % vaginal cream, Place 1 applicator vaginally at bedtime. (Patient not taking: Reported on 12/02/2016), Disp: 45 g, Rfl: 0 .  [employer] THIN LANCETS MISC, 60 Sticks by Does not apply route 2 (two) times daily. Patient needs lancets to go with sure to go lancet device. If patient does not have you may fill lancet device and lancets generic brand. Patient to check CBG bid., Disp: 100 each, Rfl: 0 No Known Allergies  Social History   Tobacco Use  . Smoking status: Never Smoker  . Smokeless tobacco: Never Used  Substance Use Topics  . Alcohol use: No    Alcohol/week: 0.0 standard drinks    Family History  Problem Relation Age of Onset  . Thyroid disease Mother   . Diabetes Father   . Hypertension Father   . Cancer Father   . Breast cancer Paternal Grandmother       Review of Systems  Constitutional: negative for fatigue and weight loss Respiratory: negative for cough and wheezing Cardiovascular: negative for chest pain, fatigue and palpitations Gastrointestinal: negative for abdominal pain and change in bowel habits Musculoskeletal:negative for myalgias Neurological: negative for gait problems and tremors Behavioral/Psych: negative for abusive relationship, depression Endocrine: negative for temperature intolerance    Genitourinary:negative for abnormal menstrual periods, genital lesions, hot flashes, sexual problems and vaginal discharge Integument/breast: negative for breast lump, breast tenderness, nipple discharge and skin lesion(s)    Objective:       BP 122/77   Pulse 73   Wt 181 lb 9.6 oz (82.4 kg)   LMP 08/01/2019   BMI 30.22 kg/m  General:   alert and no distress  Skin:   no rash or abnormalities  Lungs:   clear to auscultation bilaterally  Heart:    regular rate and rhythm, S1, S2 normal, no murmur, click, rub or gallop  Breasts:   normal without suspicious masses, skin or nipple changes or axillary nodes  Abdomen:  normal findings: no organomegaly, soft, non-tender and no hernia  Pelvis:  External genitalia: normal general appearance Urinary system: urethral meatus normal and bladder without fullness, nontender Vaginal: normal without tenderness, induration or masses Cervix: normal appearance Adnexa: normal bimanual exam Uterus: anteverted and non-tender, normal size   Lab Review Urine pregnancy test Labs reviewed yes Radiologic studies reviewed yes  50% of 20 min visit spent on  counseling and coordination of care.   Assessment:     1. Encounter for gynecological examination with Papanicolaou smear of cervix Rx: - Cytology - PAP( [place])  2. Vaginal discharge Rx: - Cervicovaginal ancillary only( [facility]) - terconazole (TERAZOL 3) 0.8 % vaginal cream; Place 1 applicator vaginally at bedtime.  Dispense: 20 g; Refill: 0 - fluconazole (DIFLUCAN) 200 MG tablet; Take 1 tablet (200 mg total) by mouth every 3 (three) days.  Dispense: 3 tablet; Refill: 2  3. Screen for STD (sexually transmitted disease) Rx: - Hepatitis B surface antigen - Hepatitis C antibody - HIV Antibody (routine testing w rflx) - RPR  4. Screening for colon cancer Rx: - Ambulatory referral to Gastroenterology  5. Screening breast examination Rx: - MM Digital Screening; Future    Plan:    Education reviewed: calcium supplements, depression evaluation, low fat, low cholesterol diet, safe sex/STD prevention, self breast exams and weight bearing exercise. Contraception: none. Mammogram ordered. Follow up in: 1 year.   Meds ordered this encounter  Medications  . terconazole (TERAZOL 3) 0.8 % vaginal cream    Sig: Place 1 applicator vaginally at bedtime.    Dispense:  20 g    Refill:  0  . fluconazole (DIFLUCAN) 200 MG tablet    Sig:  Take 1 tablet (200 mg total) by mouth every 3 (three) days.    Dispense:  3 tablet    Refill:  2   Orders Placed This Encounter  Procedures  . MM Digital Screening    Standing Status:   Future    Standing Expiration Date:   08/15/2020    Order Specific Question:   Reason for Exam (SYMPTOM  OR DIAGNOSIS REQUIRED)    Answer:   Screening    Order Specific Question:   Is the patient pregnant?    Answer:   No    Order Specific Question:   Preferred imaging location?    Answer:   [facility]  . Hepatitis B surface antigen  . Hepatitis C antibody  . HIV Antibody (routine testing w rflx)  . RPR  . Ambulatory referral to Gastroenterology    Referral Priority:   Routine    Referral Type:   Consultation    Referral Reason:   Specialty Services Required    Number of Visits Requested:   1    Shelly Bombard, MD 08/16/2019 3:52 PM

## 2019-08-16 NOTE — Progress Notes (Signed)
Pt presents annual and pap. Pt c/o vaginal irritation and vaginal discharge  Needs routine Mammogram Needs colonoscopy

## 2019-08-17 LAB — CERVICOVAGINAL ANCILLARY ONLY
Bacterial Vaginitis (gardnerella): NEGATIVE
Candida Glabrata: POSITIVE — AB
Candida Vaginitis: POSITIVE — AB
Chlamydia: NEGATIVE
Comment: NEGATIVE
Comment: NEGATIVE
Comment: NEGATIVE
Comment: NEGATIVE
Comment: NEGATIVE
Comment: NORMAL
Neisseria Gonorrhea: NEGATIVE
Trichomonas: NEGATIVE

## 2019-08-17 LAB — CYTOLOGY - PAP
Comment: NEGATIVE
Diagnosis: NEGATIVE
High risk HPV: NEGATIVE

## 2019-08-17 LAB — HIV ANTIBODY (ROUTINE TESTING W REFLEX): HIV Screen 4th Generation wRfx: NONREACTIVE

## 2019-08-17 LAB — HEPATITIS B SURFACE ANTIGEN: Hepatitis B Surface Ag: NEGATIVE

## 2019-08-17 LAB — RPR: RPR Ser Ql: NONREACTIVE

## 2019-08-17 LAB — HEPATITIS C ANTIBODY: Hep C Virus Ab: <0.1 s/co ratio (ref 0.0–0.9)

## 2019-08-18 ENCOUNTER — Other Ambulatory Visit: Payer: Self-pay | Admitting: Obstetrics

## 2019-10-04 ENCOUNTER — Ambulatory Visit: Payer: Medicaid Other

## 2019-10-11 ENCOUNTER — Other Ambulatory Visit: Payer: Self-pay | Admitting: Obstetrics

## 2019-10-11 DIAGNOSIS — N898 Other specified noninflammatory disorders of vagina: Secondary | ICD-10-CM

## 2019-10-11 MED ORDER — FLUCONAZOLE 200 MG PO TABS: 200.0000 mg | ORAL_TABLET | ORAL | 2 refills | Status: DC

## 2019-11-25 ENCOUNTER — Ambulatory Visit: Payer: Medicaid Other

## 2019-11-28 ENCOUNTER — Ambulatory Visit
Admission: RE | Admit: 2019-11-28 | Discharge: 2019-11-28 | Disposition: A | Discharge status: Home / Self Care | Payer: Medicaid Other | Source: Ambulatory Visit | Attending: Obstetrics | Admitting: Obstetrics

## 2019-11-28 ENCOUNTER — Other Ambulatory Visit: Payer: Self-pay

## 2019-11-28 DIAGNOSIS — Z1239 Encounter for other screening for malignant neoplasm of breast: Secondary | ICD-10-CM

## 2019-12-12 LAB — COLOGUARD: COLOGUARD: NEGATIVE

## 2020-01-04 ENCOUNTER — Other Ambulatory Visit: Payer: Self-pay | Admitting: Obstetrics

## 2020-01-04 DIAGNOSIS — N898 Other specified noninflammatory disorders of vagina: Secondary | ICD-10-CM

## 2020-02-01 ENCOUNTER — Other Ambulatory Visit: Payer: Self-pay | Admitting: Obstetrics

## 2020-02-01 DIAGNOSIS — B9689 Other specified bacterial agents as the cause of diseases classified elsewhere: Secondary | ICD-10-CM

## 2020-02-01 DIAGNOSIS — N898 Other specified noninflammatory disorders of vagina: Secondary | ICD-10-CM

## 2020-02-01 DIAGNOSIS — B3731 Acute candidiasis of vulva and vagina: Secondary | ICD-10-CM

## 2020-02-01 DIAGNOSIS — B373 Candidiasis of vulva and vagina: Secondary | ICD-10-CM

## 2020-02-01 MED ORDER — FLUCONAZOLE 200 MG PO TABS: 200.0000 mg | ORAL_TABLET | ORAL | 2 refills | Status: DC

## 2020-02-01 MED ORDER — CLINDAMYCIN HCL 150 MG PO CAPS: 150.0000 mg | ORAL_CAPSULE | Freq: Three times a day (TID) | ORAL | 2 refills | Status: AC

## 2020-07-18 ENCOUNTER — Other Ambulatory Visit: Payer: Self-pay | Admitting: Obstetrics

## 2020-07-18 DIAGNOSIS — B3731 Acute candidiasis of vulva and vagina: Secondary | ICD-10-CM

## 2020-07-18 DIAGNOSIS — B373 Candidiasis of vulva and vagina: Secondary | ICD-10-CM

## 2020-08-07 ENCOUNTER — Other Ambulatory Visit: Payer: Self-pay | Admitting: Obstetrics

## 2020-08-07 DIAGNOSIS — B373 Candidiasis of vulva and vagina: Secondary | ICD-10-CM

## 2020-08-07 DIAGNOSIS — B3731 Acute candidiasis of vulva and vagina: Secondary | ICD-10-CM

## 2020-08-10 ENCOUNTER — Ambulatory Visit: Payer: Self-pay

## 2020-08-14 ENCOUNTER — Other Ambulatory Visit: Payer: Self-pay

## 2020-08-14 ENCOUNTER — Ambulatory Visit (INDEPENDENT_AMBULATORY_CARE_PROVIDER_SITE_OTHER): Payer: Medicaid Other

## 2020-08-14 ENCOUNTER — Other Ambulatory Visit (HOSPITAL_COMMUNITY)
Admission: RE | Admit: 2020-08-14 | Discharge: 2020-08-14 | Disposition: A | Discharge status: Home / Self Care | Payer: Medicaid Other | Source: Ambulatory Visit | Attending: Obstetrics and Gynecology | Admitting: Obstetrics and Gynecology

## 2020-08-14 VITALS — BP 120/76 | HR 82 | Ht 63.0 in | Wt 176.0 lb

## 2020-08-14 DIAGNOSIS — N898 Other specified noninflammatory disorders of vagina: Secondary | ICD-10-CM

## 2020-08-14 NOTE — Progress Notes (Signed)
SUBJECTIVE:  50 y.o. female complains of pink, odorless vaginal discharge for 10 day(s). Denies abnormal vaginal bleeding or significant pelvic pain or fever. No UTI symptoms. Denies history of known exposure to STD.  LMP: 08/09/20  OBJECTIVE:  She appears well, afebrile. Urine dipstick: not done.  ASSESSMENT:  Vaginal Discharge  Vaginal Odor   PLAN:  BVAG, CVAG probe sent to lab. Treatment: To be determined once lab results are received ROV prn if symptoms persist or worsen.

## 2020-08-15 LAB — CERVICOVAGINAL ANCILLARY ONLY
Bacterial Vaginitis (gardnerella): NEGATIVE
Candida Glabrata: POSITIVE — AB
Candida Vaginitis: NEGATIVE
Comment: NEGATIVE
Comment: NEGATIVE
Comment: NEGATIVE

## 2020-08-16 ENCOUNTER — Other Ambulatory Visit: Payer: Self-pay | Admitting: Obstetrics

## 2020-08-16 ENCOUNTER — Telehealth: Payer: Self-pay

## 2020-08-16 DIAGNOSIS — B379 Candidiasis, unspecified: Secondary | ICD-10-CM

## 2020-08-16 MED ORDER — AZO BORIC ACID 600 MG VA SUPP: 1.0000 | Freq: Every evening | VAGINAL | 2 refills | Status: DC

## 2020-08-16 NOTE — Telephone Encounter (Signed)
Pt called and given lab results Pt informed medication was sent to [facility].

## 2020-11-27 ENCOUNTER — Other Ambulatory Visit: Payer: Self-pay | Admitting: Internal Medicine

## 2020-11-29 LAB — TIQ-NTM

## 2020-11-30 LAB — INFLUENZA A AND B AG, IMMUNOASSAY
INFLUENZA A ANTIGEN: NOT DETECTED
INFLUENZA B ANTIGEN: NOT DETECTED
MICRO NUMBER:: 12669933
SPECIMEN QUALITY:: ADEQUATE

## 2021-01-29 ENCOUNTER — Other Ambulatory Visit: Payer: Self-pay | Admitting: Internal Medicine

## 2021-01-29 ENCOUNTER — Other Ambulatory Visit: Payer: Self-pay | Admitting: *Deleted

## 2021-01-29 DIAGNOSIS — B379 Candidiasis, unspecified: Secondary | ICD-10-CM

## 2021-01-29 DIAGNOSIS — B3731 Acute candidiasis of vulva and vagina: Secondary | ICD-10-CM

## 2021-01-29 MED ORDER — FLUCONAZOLE 200 MG PO TABS: 200.0000 mg | ORAL_TABLET | ORAL | 0 refills | Status: DC

## 2021-01-29 MED ORDER — AZO BORIC ACID 600 MG VA SUPP: 1.0000 | Freq: Every evening | VAGINAL | 2 refills | Status: DC

## 2021-01-29 NOTE — Progress Notes (Signed)
TC from patient regarding refill for boric acid and diflucan. Annual exam past due but is scheduled for 02/05/21. RXs sent.

## 2021-01-30 LAB — SYNOVIAL FLUID ANALYSIS, COMPLETE
Basophils, %: 0 %
Eosinophils-Synovial: 0 % (ref 0–2)
Lymphocytes-Synovial Fld: 74 % (ref 0–74)
Monocyte/Macrophage: 17 % (ref 0–69)
Neutrophil, Synovial: 8 % (ref 0–24)
Synoviocytes, %: 1 % (ref 0–15)
WBC, Synovial: 460 cells/uL — ABNORMAL HIGH (ref <150)

## 2021-01-30 LAB — TIQ-NTM

## 2021-02-01 ENCOUNTER — Ambulatory Visit: Payer: Medicaid Other | Admitting: Obstetrics

## 2021-02-05 ENCOUNTER — Ambulatory Visit: Payer: Medicaid Other | Admitting: Obstetrics

## 2021-03-05 ENCOUNTER — Ambulatory Visit (INDEPENDENT_AMBULATORY_CARE_PROVIDER_SITE_OTHER): Payer: Medicaid Other | Admitting: Obstetrics

## 2021-03-05 ENCOUNTER — Other Ambulatory Visit (HOSPITAL_COMMUNITY)
Admission: RE | Admit: 2021-03-05 | Discharge: 2021-03-05 | Disposition: A | Discharge status: Home / Self Care | Payer: Medicaid Other | Source: Ambulatory Visit | Attending: Obstetrics | Admitting: Obstetrics

## 2021-03-05 ENCOUNTER — Encounter: Payer: Self-pay | Admitting: Obstetrics

## 2021-03-05 ENCOUNTER — Other Ambulatory Visit: Payer: Self-pay

## 2021-03-05 VITALS — BP 126/80 | HR 71 | Ht 65.0 in | Wt 172.0 lb

## 2021-03-05 DIAGNOSIS — B3731 Acute candidiasis of vulva and vagina: Secondary | ICD-10-CM | POA: Diagnosis not present

## 2021-03-05 DIAGNOSIS — Z01419 Encounter for gynecological examination (general) (routine) without abnormal findings: Secondary | ICD-10-CM | POA: Insufficient documentation

## 2021-03-05 DIAGNOSIS — N898 Other specified noninflammatory disorders of vagina: Secondary | ICD-10-CM | POA: Insufficient documentation

## 2021-03-05 DIAGNOSIS — B379 Candidiasis, unspecified: Secondary | ICD-10-CM

## 2021-03-05 DIAGNOSIS — Z1239 Encounter for other screening for malignant neoplasm of breast: Secondary | ICD-10-CM

## 2021-03-05 DIAGNOSIS — N943 Premenstrual tension syndrome: Secondary | ICD-10-CM

## 2021-03-05 MED ORDER — AZO BORIC ACID 600 MG VA SUPP: 1.0000 | Freq: Every evening | VAGINAL | 2 refills | Status: DC

## 2021-03-05 MED ORDER — FLUCONAZOLE 200 MG PO TABS: 200.0000 mg | ORAL_TABLET | ORAL | 2 refills | Status: DC

## 2021-03-05 MED ORDER — PAROXETINE HCL 20 MG PO TABS: 20.0000 mg | ORAL_TABLET | Freq: Every day | ORAL | 11 refills | Status: DC

## 2021-03-05 MED ORDER — CLOTRIMAZOLE 1 % EX CREA: 1.0000 "application " | TOPICAL_CREAM | Freq: Two times a day (BID) | CUTANEOUS | 0 refills | Status: DC

## 2021-03-05 NOTE — Progress Notes (Signed)
51 y.o. GYN presents for vaginal irritation, anal irritation, milky discharge x 7 days.

## 2021-03-05 NOTE — Progress Notes (Signed)
Subjective:        Emily Navarro is a 51 y.o. female here for a routine exam.  Current complaints: Vaginal discharge with irritation.  Also c/o severe mood swings and irritability around period time; and also has had decreased sex drive for the past 2 years.    Personal health questionnaire:  Is patient Emily Navarro, have a family history of breast and/or ovarian cancer: no Is there a family history of uterine cancer diagnosed at age < 1, gastrointestinal cancer, urinary tract cancer, family member who is a Field seismologist syndrome-associated carrier: no Is the patient overweight and hypertensive, family history of diabetes, personal history of gestational diabetes, preeclampsia or PCOS: no Is patient over 78, have PCOS,  family history of premature CHD under age 11, diabetes, smoke, have hypertension or peripheral artery disease:  no At any time, has a partner hit, kicked or otherwise hurt or frightened you?: no Over the past 2 weeks, have you felt down, depressed or hopeless?: no Over the past 2 weeks, have you felt little interest or pleasure in doing things?:no   Gynecologic History Patient's last menstrual period was 02/09/2021 (exact date). Contraception: none Last Pap: 08-16-2019. Results were: normal Last mammogram: 11-28-2019. Results were: normal  Obstetric History OB History  Gravida Para Term Preterm AB Living  8 3 3   5 3   SAB IAB Ectopic Multiple Live Births  2 3     3     # Outcome Date GA Lbr Len/2nd Weight Sex Delivery Anes PTL Lv  8 Term 01/09/09    M Vag-Spont   LIV  7 Term 01/03/93    F Vag-Spont   LIV  6 Term 04/05/89    M Vag-Spont   LIV  5 IAB           4 IAB           3 IAB           2 SAB           1 SAB             Past Medical History:  Diagnosis Date   Bipolar 1 disorder ([place])    Diabetes mellitus without complication ([place])    Hypertension     Past Surgical History:  Procedure Laterality Date   abdominoplasty     HYSTEROSCOPY     tummy  tuck       Current Outpatient Medications:    clotrimazole (LOTRIMIN) 1 % cream, Apply 1 application topically 2 (two) times daily., Disp: 30 g, Rfl: 0   fluconazole (DIFLUCAN) 200 MG tablet, Take 1 tablet (200 mg total) by mouth every 3 (three) days., Disp: 3 tablet, Rfl: 2   Boric Acid Vaginal (AZO BORIC ACID) 600 MG SUPP, Place 1 suppository vaginally at bedtime. For 14 days., Disp: 30 suppository, Rfl: 2   canagliflozin (INVOKANA) 100 MG TABS tablet, Take by mouth daily before breakfast., Disp: , Rfl:    clindamycin (CLEOCIN) 150 MG capsule, Take 1 capsule (150 mg total) by mouth 3 (three) times daily. (Patient not taking: Reported on 08/14/2020), Disp: 21 capsule, Rfl: 2   clonazePAM (KLONOPIN) 1 MG tablet, Take 1 mg by mouth daily., Disp: , Rfl:    fluconazole (DIFLUCAN) 200 MG tablet, Take 1 tablet (200 mg total) by mouth every 3 (three) days., Disp: 1 tablet, Rfl: 0   glucose blood test strip, Use as instructed . Patient uses Sure to go meter, Disp: 100 each, Rfl:  12   HYDROcodone-acetaminophen (NORCO/VICODIN) 5-325 MG tablet, Take 1 tablet by mouth every 6 (six) hours as needed for severe pain., Disp: 6 tablet, Rfl: 0   ibuprofen (ADVIL,MOTRIN) 800 MG tablet, Take 800 mg by mouth every 8 (eight) hours as needed., Disp: , Rfl:    lisinopril (PRINIVIL,ZESTRIL) 5 MG tablet, Take 5 mg by mouth daily., Disp: , Rfl:    metFORMIN (GLUCOPHAGE-XR) 500 MG 24 hr tablet, TAKE 4 TABLETS (2,000 MG TOTAL) BY MOUTH DAILY WITH SUPPER., Disp: 120 tablet, Rfl: 11   PARoxetine (PAXIL) 20 MG tablet, Take 1 tablet (20 mg total) by mouth daily., Disp: 30 tablet, Rfl: 11   terconazole (TERAZOL 3) 0.8 % vaginal cream, Place 1 applicator vaginally at bedtime. (Patient not taking: Reported on 08/14/2020), Disp: 20 g, Rfl: 0   terconazole (TERAZOL 7) 0.4 % vaginal cream, Place 1 applicator vaginally at bedtime. (Patient not taking: No sig reported), Disp: 45 g, Rfl: 0   triamterene-hydrochlorothiazide (MAXZIDE-25)  37.5-25 MG per tablet, Take 1 tablet by mouth daily., Disp: 30 tablet, Rfl: 11   [employer] THIN LANCETS MISC, 60 Sticks by Does not apply route 2 (two) times daily. Patient needs lancets to go with sure to go lancet device. If patient does not have you may fill lancet device and lancets generic brand. Patient to check CBG bid., Disp: 100 each, Rfl: 0   zolpidem (AMBIEN) 5 MG tablet, Take 10 mg by mouth at bedtime as needed for sleep (1/2 tablet)., Disp: , Rfl:  Allergies  Allergen Reactions   Latex Rash    Social History   Tobacco Use   Smoking status: Never   Smokeless tobacco: Never  Substance Use Topics   Alcohol use: No    Alcohol/week: 0.0 standard drinks    Family History  Problem Relation Age of Onset   Thyroid disease Mother    Diabetes Father    Hypertension Father    Cancer Father    Breast cancer Paternal Grandmother       Review of Systems  Constitutional: negative for fatigue and weight loss Respiratory: negative for cough and wheezing Cardiovascular: negative for chest pain, fatigue and palpitations Gastrointestinal: negative for abdominal pain and change in bowel habits Musculoskeletal:negative for myalgias Neurological: negative for gait problems and tremors Behavioral/Psych: positive for PMS.  negative for abusive relationship, depression Endocrine: negative for temperature intolerance    Genitourinary: positive for vaginal discharge with itching and severe mood swings and irritability around period time.  Also has decreased libido.  negative for abnormal menstrual periods, genital lesions, hot flashes Integument/breast: negative for breast lump, breast tenderness, nipple discharge and skin lesion(s)    Objective:       BP 126/80    Pulse 71    Ht 5\' 5"  (1.651 m)    Wt 172 lb (78 kg)    LMP 02/09/2021 (Exact Date)    BMI 28.62 kg/m  General:   Alert and no distress  Skin:   no rash or abnormalities  Lungs:   clear to auscultation bilaterally  Heart:    regular rate and rhythm, S1, S2 normal, no murmur, click, rub or gallop  Breasts:   normal without suspicious masses, skin or nipple changes or axillary nodes  Abdomen:  normal findings: no organomegaly, soft, non-tender and no hernia  Pelvis:  External genitalia: normal general appearance Urinary system: urethral meatus normal and bladder without fullness, nontender Vaginal: normal without tenderness, induration or masses Cervix: normal appearance Adnexa: normal bimanual exam  Uterus: anteverted and non-tender, normal size   Lab Review Urine pregnancy test Labs reviewed yes Radiologic studies reviewed yes  I have spent a total of 25 minutes of face-to-face  time, excluding clinical staff time, reviewing notes and preparing to see patient, ordering tests and/or medications, and counseling the patient.   Assessment:    1. Encounter for gynecological examination with Papanicolaou smear of cervix Rx: - Cytology - PAP( [facility]) - POCT Urinalysis Dipstick  2. PMS (premenstrual syndrome) Rx: - PARoxetine (PAXIL) 20 MG tablet; Take 1 tablet (20 mg total) by mouth daily.  Dispense: 30 tablet; Refill: 11  3. Vaginal discharge Rx: - Cervicovaginal ancillary only( [place])  4. Candida vaginitis Rx: - fluconazole (DIFLUCAN) 200 MG tablet; Take 1 tablet (200 mg total) by mouth every 3 (three) days.  Dispense: 3 tablet; Refill: 2 - clotrimazole (LOTRIMIN) 1 % cream; Apply 1 application topically 2 (two) times daily.  Dispense: 30 g; Refill: 0  5. Screening breast examination Rx: - MM Digital Screening; Future  6. Candida glabrata infection Rx: - Boric Acid Vaginal (AZO BORIC ACID) 600 MG SUPP; Place 1 suppository vaginally at bedtime. For 14 days.  Dispense: 30 suppository; Refill: 2     Plan:    Education reviewed: calcium supplements, depression evaluation, low fat, low cholesterol diet, safe sex/STD prevention, self breast exams, and weight bearing exercise. Mammogram  ordered. Follow up in: 3 months.   Meds ordered this encounter  Medications   fluconazole (DIFLUCAN) 200 MG tablet    Sig: Take 1 tablet (200 mg total) by mouth every 3 (three) days.    Dispense:  3 tablet    Refill:  2   clotrimazole (LOTRIMIN) 1 % cream    Sig: Apply 1 application topically 2 (two) times daily.    Dispense:  30 g    Refill:  0   PARoxetine (PAXIL) 20 MG tablet    Sig: Take 1 tablet (20 mg total) by mouth daily.    Dispense:  30 tablet    Refill:  11   Orders Placed This Encounter  Procedures   MM Digital Screening    Standing Status:   Future    Standing Expiration Date:   03/05/2022    Order Specific Question:   Reason for Exam (SYMPTOM  OR DIAGNOSIS REQUIRED)    Answer:   Screening    Order Specific Question:   Is the patient pregnant?    Answer:   No    Order Specific Question:   Preferred imaging location?    Answer:   [facility]   POCT Urinalysis Dipstick     Shelly Bombard, MD 03/05/2021 2:15 PM

## 2021-03-06 ENCOUNTER — Other Ambulatory Visit: Payer: Self-pay | Admitting: Obstetrics

## 2021-03-06 LAB — CERVICOVAGINAL ANCILLARY ONLY
Bacterial Vaginitis (gardnerella): NEGATIVE
Candida Glabrata: POSITIVE — AB
Candida Vaginitis: POSITIVE — AB
Chlamydia: NEGATIVE
Comment: NEGATIVE
Comment: NEGATIVE
Comment: NEGATIVE
Comment: NEGATIVE
Comment: NEGATIVE
Comment: NORMAL
Neisseria Gonorrhea: NEGATIVE
Trichomonas: NEGATIVE

## 2021-03-06 LAB — POCT URINALYSIS DIPSTICK
Bilirubin, UA: POSITIVE
Blood, UA: NEGATIVE
Glucose, UA: POSITIVE — AB
Ketones, UA: NEGATIVE
Nitrite, UA: NEGATIVE
Protein, UA: POSITIVE — AB
Spec Grav, UA: 1.025 (ref 1.010–1.025)
Urobilinogen, UA: 0.2 E.U./dL
pH, UA: 7 (ref 5.0–8.0)

## 2021-03-07 LAB — CYTOLOGY - PAP
Comment: NEGATIVE
Diagnosis: NEGATIVE
High risk HPV: NEGATIVE

## 2021-03-26 ENCOUNTER — Encounter: Payer: Self-pay | Admitting: Obstetrics

## 2021-03-26 ENCOUNTER — Telehealth (INDEPENDENT_AMBULATORY_CARE_PROVIDER_SITE_OTHER): Payer: Medicaid Other | Admitting: Obstetrics

## 2021-03-26 DIAGNOSIS — N760 Acute vaginitis: Secondary | ICD-10-CM | POA: Diagnosis not present

## 2021-03-26 DIAGNOSIS — B370 Candidal stomatitis: Secondary | ICD-10-CM

## 2021-03-26 DIAGNOSIS — N943 Premenstrual tension syndrome: Secondary | ICD-10-CM

## 2021-03-26 DIAGNOSIS — B9689 Other specified bacterial agents as the cause of diseases classified elsewhere: Secondary | ICD-10-CM

## 2021-03-26 DIAGNOSIS — B3731 Acute candidiasis of vulva and vagina: Secondary | ICD-10-CM | POA: Diagnosis not present

## 2021-03-26 DIAGNOSIS — B379 Candidiasis, unspecified: Secondary | ICD-10-CM

## 2021-03-26 DIAGNOSIS — Z1239 Encounter for other screening for malignant neoplasm of breast: Secondary | ICD-10-CM

## 2021-03-26 NOTE — Progress Notes (Signed)
S/w pt for virtual visit. Pt states no complications since starting medications after last visit on 03/05/21  ?

## 2021-03-26 NOTE — Progress Notes (Signed)
? ? ?  GYNECOLOGY VIRTUAL VISIT ENCOUNTER NOTE ? ?Provider location: Center for [facility] at [place]  ? ?Patient location: Home ? ?I connected with Emily Navarro on 03/26/21 at 11:15 AM EDT by MyChart Video Encounter and verified that I am speaking with the correct person using two identifiers. ?  ?I discussed the limitations, risks, security and privacy concerns of performing an evaluation and management service virtually and the availability of in person appointments. I also discussed with the patient that there may be a patient responsible charge related to this service. The patient expressed understanding and agreed to proceed. ?  ?History:  ?Emily Navarro is a 50 y.o. [phone] female being evaluated today for vaginal and oral yeast infections. She was treated with Terazol 3, Diflucan, Clotrimazole and Magic Mouthwash with good results clinically. No complaints today. She denies any abnormal vaginal discharge, bleeding, pelvic pain or other concerns.   ?  ?  ?Past Medical History:  ?Diagnosis Date  ? Bipolar 1 disorder ([place])   ? Diabetes mellitus without complication ([place])   ? Hypertension   ? ?Past Surgical History:  ?Procedure Laterality Date  ? abdominoplasty    ? HYSTEROSCOPY    ? tummy tuck    ? ?The following portions of the patient's history were reviewed and updated as appropriate: allergies, current medications, past family history, past medical history, past social history, past surgical history and problem list.  ? ?Health Maintenance:  Normal pap and negative HRHPV on 03-05-2021.  Normal mammogram on 12-01-2019.  ? ?Review of Systems:  ?Pertinent items noted in HPI and remainder of comprehensive ROS otherwise negative. ? ?Physical Exam:  ? ?General:  Alert, oriented and cooperative. Patient appears to be in no acute distress.  ?Mental Status: Normal mood and affect. Normal behavior. Normal judgment and thought content.   ?Respiratory: Normal respiratory effort, no problems with  respiration noted  ?Rest of physical exam deferred due to type of encounter ? ?Labs and Imaging ?No results found for this or any previous visit (from the past 336 hour(s)). ?No results found.   ?  ?Assessment and Plan:  ?   ?  1. Candida vaginitis ?- Terazol 3 Rx ?- Diflucan 200 mg Rx qod  x 3 ? ?2. Candidal vulvitis ?- Clotrimazole 1% cream Rx ? ?3. Oral candidiasis ?- Magic Mouthwash Swish and Swallow Rx ? ?4. Candida glabrata infection ?- Boric Acid 600 mg vaginal suppositories Rx ? ?5. BV (bacterial vaginosis) ?- Metronidazole 500 mg tabs Rx  ? ?6. PMS (premenstrual syndrome) ?- Paxil Rx ? ?7. Screening breast examination ?Rx: ?- MM Digital Screening; Future  ? ?  ?I discussed the assessment and treatment plan with the patient. The patient was provided an opportunity to ask questions and all were answered. The patient agreed with the plan and demonstrated an understanding of the instructions. ?  ?The patient was advised to call back or seek an in-person evaluation/go to the ED if the symptoms worsen or if the condition fails to improve as anticipated. ? ?I have spent a total of 15 minutes of non-face-to-face time, excluding clinical staff time, reviewing notes and preparing to see patient, ordering tests and/or medications, and counseling the patient.  ? ? ?Baltazar Najjar, MD ?Center for [place], [place], [facility] ?03/26/21  ?

## 2021-03-27 ENCOUNTER — Other Ambulatory Visit: Payer: Self-pay | Admitting: Obstetrics

## 2021-03-27 DIAGNOSIS — N943 Premenstrual tension syndrome: Secondary | ICD-10-CM

## 2021-05-07 ENCOUNTER — Encounter: Payer: Self-pay | Admitting: Obstetrics

## 2021-05-07 ENCOUNTER — Ambulatory Visit (INDEPENDENT_AMBULATORY_CARE_PROVIDER_SITE_OTHER): Payer: Medicaid Other | Admitting: Obstetrics

## 2021-05-07 ENCOUNTER — Other Ambulatory Visit (HOSPITAL_COMMUNITY)
Admission: RE | Admit: 2021-05-07 | Discharge: 2021-05-07 | Disposition: A | Discharge status: Home / Self Care | Payer: Medicaid Other | Source: Ambulatory Visit | Attending: Obstetrics | Admitting: Obstetrics

## 2021-05-07 VITALS — BP 102/68 | HR 89 | Ht 65.0 in | Wt 171.0 lb

## 2021-05-07 DIAGNOSIS — B3731 Acute candidiasis of vulva and vagina: Secondary | ICD-10-CM

## 2021-05-07 DIAGNOSIS — B369 Superficial mycosis, unspecified: Secondary | ICD-10-CM

## 2021-05-07 MED ORDER — CLOTRIMAZOLE-BETAMETHASONE 1-0.05 % EX CREA: 1.0000 "application " | TOPICAL_CREAM | Freq: Two times a day (BID) | CUTANEOUS | 0 refills | Status: DC

## 2021-05-07 MED ORDER — FLUCONAZOLE 200 MG PO TABS
200.0000 mg | ORAL_TABLET | ORAL | 2 refills | Status: DC
Start: 2021-05-07 — End: 2022-03-06

## 2021-05-07 NOTE — Addendum Note (Signed)
Addended by: Tamela Oddi on: 05/07/2021 03:28 PM ? ? Modules accepted: Orders ? ?

## 2021-05-07 NOTE — Progress Notes (Signed)
51 y.o GYN presents for Vaginal Itching/irritation x 4 weeks.  Denies pain, discharge, odor, fever, chills. ? ? ?Last PAP 03/05/2021 ?

## 2021-05-07 NOTE — Progress Notes (Signed)
Patient ID: [name], female   DOB: [birth year], 51 y.o.   MRN: [id number] ? ?Chief Complaint  ?Patient presents with  ? VAGINAL IRRITATION  ? ? ?HPI ?Emily Navarro is a 51 y.o. female.  Complains of vaginal discharge and itching of vulva / perineal areas, after shaving. ?HPI ? ?Past Medical History:  ?Diagnosis Date  ? Bipolar 1 disorder ([place])   ? Diabetes mellitus without complication ([place])   ? Hypertension   ? ? ?Past Surgical History:  ?Procedure Laterality Date  ? abdominoplasty    ? HYSTEROSCOPY    ? tummy tuck    ? ? ?Family History  ?Problem Relation Age of Onset  ? Thyroid disease Mother   ? Diabetes Father   ? Hypertension Father   ? Cancer Father   ? Breast cancer Paternal Grandmother   ? ? ?Social History ?Social History  ? ?Tobacco Use  ? Smoking status: Never  ? Smokeless tobacco: Never  ?Vaping Use  ? Vaping Use: Never used  ?Substance Use Topics  ? Alcohol use: No  ?  Alcohol/week: 0.0 standard drinks  ? Drug use: No  ? ? ?Allergies  ?Allergen Reactions  ? Latex Rash  ? ? ?Current Outpatient Medications  ?Medication Sig Dispense Refill  ? clotrimazole-betamethasone (LOTRISONE) cream Apply 1 application. topically 2 (two) times daily. 45 g 0  ? Boric Acid Vaginal (AZO BORIC ACID) 600 MG SUPP Place 1 suppository vaginally at bedtime. For 14 days. 30 suppository 2  ? canagliflozin (INVOKANA) 100 MG TABS tablet Take by mouth daily before breakfast.    ? clindamycin (CLEOCIN) 150 MG capsule Take 1 capsule (150 mg total) by mouth 3 (three) times daily. (Patient not taking: Reported on 08/14/2020) 21 capsule 2  ? clonazePAM (KLONOPIN) 1 MG tablet Take 1 mg by mouth daily.    ? clotrimazole (LOTRIMIN) 1 % cream Apply 1 application topically 2 (two) times daily. 30 g 0  ? fluconazole (DIFLUCAN) 200 MG tablet Take 1 tablet (200 mg total) by mouth every 3 (three) days. 3 tablet 2  ? glucose blood test strip Use as instructed . Patient uses Sure to go meter 100 each 12  ? HYDROcodone-acetaminophen  (NORCO/VICODIN) 5-325 MG tablet Take 1 tablet by mouth every 6 (six) hours as needed for severe pain. 6 tablet 0  ? ibuprofen (ADVIL,MOTRIN) 800 MG tablet Take 800 mg by mouth every 8 (eight) hours as needed.    ? lisinopril (PRINIVIL,ZESTRIL) 5 MG tablet Take 5 mg by mouth daily.    ? metFORMIN (GLUCOPHAGE-XR) 500 MG 24 hr tablet TAKE 4 TABLETS (2,000 MG TOTAL) BY MOUTH DAILY WITH SUPPER. 120 tablet 11  ? PARoxetine (PAXIL) 20 MG tablet TAKE 1 TABLET BY MOUTH EVERY DAY 90 tablet 4  ? terconazole (TERAZOL 3) 0.8 % vaginal cream Place 1 applicator vaginally at bedtime. (Patient not taking: Reported on 08/14/2020) 20 g 0  ? triamterene-hydrochlorothiazide (MAXZIDE-25) 37.5-25 MG per tablet Take 1 tablet by mouth daily. 30 tablet 11  ? [employer] THIN LANCETS MISC 60 Sticks by Does not apply route 2 (two) times daily. Patient needs lancets to go with sure to go lancet device. If patient does not have you may fill lancet device and lancets generic brand. Patient to check CBG bid. 100 each 0  ? zolpidem (AMBIEN) 5 MG tablet Take 10 mg by mouth at bedtime as needed for sleep (1/2 tablet).    ? ?No current facility-administered medications for this visit.  ? ? ?  Review of Systems ?Review of Systems ?Constitutional: negative for fatigue and weight loss ?Respiratory: negative for cough and wheezing ?Cardiovascular: negative for chest pain, fatigue and palpitations ?Gastrointestinal: negative for abdominal pain and change in bowel habits ?Genitourinary: positive for vaginal discharge and vulva / perineal pruritus ?Integument/breast: negative for nipple discharge ?Musculoskeletal:negative for myalgias ?Neurological: negative for gait problems and tremors ?Behavioral/Psych: negative for abusive relationship, depression ?Endocrine: negative for temperature intolerance    ?  ?Blood pressure 102/68, pulse 89, height '5\' 5"'$  (1.651 m), weight 171 lb (77.6 kg), last menstrual period 03/17/2021. ? ?Physical Exam ?Physical Exam ?General:    Alert and no distress  ?Skin:   no rash or abnormalities  ?Lungs:   clear to auscultation bilaterally  ?Heart:   regular rate and rhythm, S1, S2 normal, no murmur, click, rub or gallop  ?Breasts:   normal without suspicious masses, skin or nipple changes or axillary nodes  ?Abdomen:  normal findings: no organomegaly, soft, non-tender and no hernia  ?Pelvis:  External genitalia: normal general appearance ?Urinary system: urethral meatus normal and bladder without fullness, nontender ?Vaginal: normal without tenderness, induration or masses ?Cervix: normal appearance ?Adnexa: normal bimanual exam ?Uterus: anteverted and non-tender, normal size  ?  ?I have spent a total of 20 minutes of face-to-face time, excluding clinical staff time, reviewing notes and preparing to see patient, ordering tests and/or medications, and counseling the patient.  ? ?Data Reviewed ?Wet Prep ? ?Assessment  ?   ?1. Superficial fungus infection of skin of vulva / perineum  ?Rx: ?- clotrimazole-betamethasone (LOTRISONE) cream; Apply 1 application. topically 2 (two) times daily.  Dispense: 45 g; Refill: 0 ? ?2. Candida vaginitis ?Rx: ?- fluconazole (DIFLUCAN) 200 MG tablet; Take 1 tablet (200 mg total) by mouth every 3 (three) days.  Dispense: 3 tablet; Refill: 2  ?  ? ?Plan ?   ?Follow up prn ? ? ? Shelly Bombard, MD ?05/07/2021 3:00 PM  ?

## 2021-05-08 LAB — CERVICOVAGINAL ANCILLARY ONLY
Bacterial Vaginitis (gardnerella): NEGATIVE
Candida Glabrata: POSITIVE — AB
Candida Vaginitis: POSITIVE — AB
Chlamydia: NEGATIVE
Comment: NEGATIVE
Comment: NEGATIVE
Comment: NEGATIVE
Comment: NEGATIVE
Comment: NEGATIVE
Comment: NORMAL
Neisseria Gonorrhea: NEGATIVE
Trichomonas: NEGATIVE

## 2021-05-11 ENCOUNTER — Other Ambulatory Visit: Payer: Self-pay | Admitting: Obstetrics

## 2021-05-11 DIAGNOSIS — B379 Candidiasis, unspecified: Secondary | ICD-10-CM

## 2021-05-11 MED ORDER — AZO BORIC ACID 600 MG VA SUPP: 1.0000 | Freq: Every evening | VAGINAL | 2 refills | Status: AC

## 2021-05-14 ENCOUNTER — Telehealth: Payer: Self-pay

## 2021-05-14 NOTE — Telephone Encounter (Signed)
S/w pt and advised of results and rx 

## 2021-05-22 ENCOUNTER — Other Ambulatory Visit: Payer: Self-pay | Admitting: Obstetrics

## 2021-05-22 DIAGNOSIS — B369 Superficial mycosis, unspecified: Secondary | ICD-10-CM

## 2021-05-22 DIAGNOSIS — B3731 Acute candidiasis of vulva and vagina: Secondary | ICD-10-CM

## 2021-10-02 ENCOUNTER — Other Ambulatory Visit: Payer: Self-pay | Admitting: Internal Medicine

## 2021-10-03 LAB — INFLUENZA A AND B AG, IMMUNOASSAY
INFLUENZA A ANTIGEN: NOT DETECTED
INFLUENZA B ANTIGEN: NOT DETECTED
MICRO NUMBER:: 13977634
SPECIMEN QUALITY:: ADEQUATE

## 2021-10-03 LAB — SARS-COV-2 RNA,(COVID-19) QUALITATIVE NAAT: SARS CoV2 RNA: NOT DETECTED

## 2022-02-05 ENCOUNTER — Encounter: Payer: Self-pay | Admitting: Obstetrics

## 2022-02-05 ENCOUNTER — Telehealth (INDEPENDENT_AMBULATORY_CARE_PROVIDER_SITE_OTHER): Payer: Medicaid Other | Admitting: Obstetrics

## 2022-02-05 DIAGNOSIS — Z1211 Encounter for screening for malignant neoplasm of colon: Secondary | ICD-10-CM

## 2022-02-05 DIAGNOSIS — Z1239 Encounter for other screening for malignant neoplasm of breast: Secondary | ICD-10-CM | POA: Diagnosis not present

## 2022-02-05 DIAGNOSIS — E2839 Other primary ovarian failure: Secondary | ICD-10-CM | POA: Diagnosis not present

## 2022-02-05 NOTE — Progress Notes (Signed)
Pt has concerns about taking ozempic. States it makes her sick and she is wondering if she should continue use.

## 2022-02-05 NOTE — Progress Notes (Unsigned)
    GYNECOLOGY VIRTUAL VISIT ENCOUNTER NOTE  Provider location: Center for [place] at {[place]:[id number]::"[facility]","[facility]","[facility]"}   Patient location: Home  I connected with Emily Navarro on 02/05/22 at  4:10 PM EST by MyChart Video Encounter and verified that I am speaking with the correct person using two identifiers.   I discussed the limitations, risks, security and privacy concerns of performing an evaluation and management service virtually and the availability of in person appointments. I also discussed with the patient that there may be a patient responsible charge related to this service. The patient expressed understanding and agreed to proceed.   History:  Emily Navarro is a 52 y.o. [phone] female being evaluated today for ***. She denies any abnormal vaginal discharge, bleeding, pelvic pain or other concerns.       Past Medical History:  Diagnosis Date   Bipolar 1 disorder ([place])    Diabetes mellitus without complication ([place])    Hypertension    Past Surgical History:  Procedure Laterality Date   abdominoplasty     HYSTEROSCOPY     tummy tuck     The following portions of the patient's history were reviewed and updated as appropriate: allergies, current medications, past family history, past medical history, past social history, past surgical history and problem list.   Health Maintenance:  Normal pap and negative HRHPV on ***.  Normal mammogram on ***.   Review of Systems:  Pertinent items noted in HPI and remainder of comprehensive ROS otherwise negative.  Physical Exam:   General:  Alert, oriented and cooperative. Patient appears to be in no acute distress.  Mental Status: Normal mood and affect. Normal behavior. Normal judgment and thought content.   Respiratory: Normal respiratory effort, no problems with respiration noted  Rest of physical  exam deferred due to type of encounter  Labs and Imaging No results found for this or any previous visit (from the past 336 hour(s)). No results found.     Assessment and Plan:     1. Hypoestrogenism *** - DG BONE DENSITY (DXA); Future  2. Screening breast examination *** - MS DIGITAL SCREENING BILATERAL; Future  3. Screening for colon cancer *** - Ambulatory referral to Gastroenterology       I discussed the assessment and treatment plan with the patient. The patient was provided an opportunity to ask questions and all were answered. The patient agreed with the plan and demonstrated an understanding of the instructions.   The patient was advised to call back or seek an in-person evaluation/go to the ED if the symptoms worsen or if the condition fails to improve as anticipated.  I provided *** minutes of face-to-face time during this encounter.   Baltazar Najjar, MD Center for [facility], [place], [facility] 02/05/2022

## 2022-02-06 ENCOUNTER — Ambulatory Visit (HOSPITAL_BASED_OUTPATIENT_CLINIC_OR_DEPARTMENT_OTHER): Payer: Medicaid Other | Admitting: Radiology

## 2022-02-06 ENCOUNTER — Encounter: Payer: Self-pay | Admitting: Obstetrics

## 2022-02-19 ENCOUNTER — Inpatient Hospital Stay (HOSPITAL_BASED_OUTPATIENT_CLINIC_OR_DEPARTMENT_OTHER): Admission: RE | Admit: 2022-02-19 | Payer: Medicaid Other | Source: Ambulatory Visit | Admitting: Radiology

## 2022-02-19 ENCOUNTER — Inpatient Hospital Stay (HOSPITAL_BASED_OUTPATIENT_CLINIC_OR_DEPARTMENT_OTHER): Admission: RE | Admit: 2022-02-19 | Payer: Medicaid Other | Source: Ambulatory Visit

## 2022-03-06 ENCOUNTER — Telehealth: Payer: Self-pay

## 2022-03-06 ENCOUNTER — Other Ambulatory Visit: Payer: Self-pay | Admitting: Obstetrics

## 2022-03-06 DIAGNOSIS — B3731 Acute candidiasis of vulva and vagina: Secondary | ICD-10-CM

## 2022-03-06 NOTE — Telephone Encounter (Signed)
Returned call, pt reporting vaginal itching, requesting refill on Diflucan, provider sent refill.

## 2022-08-21 ENCOUNTER — Inpatient Hospital Stay: Admission: RE | Admit: 2022-08-21 | Payer: Medicaid Other | Source: Ambulatory Visit

## 2022-10-23 ENCOUNTER — Other Ambulatory Visit: Payer: Self-pay

## 2022-10-23 DIAGNOSIS — B3731 Acute candidiasis of vulva and vagina: Secondary | ICD-10-CM

## 2022-10-23 MED ORDER — FLUCONAZOLE 150 MG PO TABS: 150.0000 mg | ORAL_TABLET | Freq: Once | ORAL | 0 refills | Status: AC

## 2022-11-04 ENCOUNTER — Ambulatory Visit: Payer: Medicaid Other | Admitting: Obstetrics and Gynecology

## 2022-11-24 ENCOUNTER — Emergency Department (HOSPITAL_BASED_OUTPATIENT_CLINIC_OR_DEPARTMENT_OTHER)
Admission: EM | Admit: 2022-11-24 | Discharge: 2022-11-24 | Disposition: A | Discharge status: Home / Self Care | Payer: Medicaid Other | Attending: Emergency Medicine | Admitting: Emergency Medicine

## 2022-11-24 ENCOUNTER — Encounter (HOSPITAL_BASED_OUTPATIENT_CLINIC_OR_DEPARTMENT_OTHER): Payer: Self-pay | Admitting: Emergency Medicine

## 2022-11-24 ENCOUNTER — Other Ambulatory Visit: Payer: Self-pay

## 2022-11-24 ENCOUNTER — Emergency Department (HOSPITAL_BASED_OUTPATIENT_CLINIC_OR_DEPARTMENT_OTHER): Payer: Medicaid Other

## 2022-11-24 DIAGNOSIS — R1031 Right lower quadrant pain: Secondary | ICD-10-CM | POA: Diagnosis present

## 2022-11-24 DIAGNOSIS — D72829 Elevated white blood cell count, unspecified: Secondary | ICD-10-CM | POA: Diagnosis not present

## 2022-11-24 DIAGNOSIS — I10 Essential (primary) hypertension: Secondary | ICD-10-CM | POA: Diagnosis not present

## 2022-11-24 DIAGNOSIS — R109 Unspecified abdominal pain: Secondary | ICD-10-CM | POA: Diagnosis not present

## 2022-11-24 DIAGNOSIS — E119 Type 2 diabetes mellitus without complications: Secondary | ICD-10-CM | POA: Insufficient documentation

## 2022-11-24 LAB — COMPREHENSIVE METABOLIC PANEL
ALT: 21 U/L (ref 0–44)
AST: 24 U/L (ref 15–41)
Albumin: 3.6 g/dL (ref 3.5–5.0)
Alkaline Phosphatase: 104 U/L (ref 38–126)
Anion gap: 11 (ref 5–15)
BUN: 15 mg/dL (ref 6–20)
CO2: 22 mmol/L (ref 22–32)
Calcium: 9.1 mg/dL (ref 8.9–10.3)
Chloride: 98 mmol/L (ref 98–111)
Creatinine, Ser: 0.88 mg/dL (ref 0.44–1.00)
GFR, Estimated: >60 mL/min (ref >60)
Glucose, Bld: 368 mg/dL — ABNORMAL HIGH (ref 70–99)
Potassium: 3.8 mmol/L (ref 3.5–5.1)
Sodium: 131 mmol/L — ABNORMAL LOW (ref 135–145)
Total Bilirubin: 1.2 mg/dL — ABNORMAL HIGH (ref <1.2)
Total Protein: 7 g/dL (ref 6.5–8.1)

## 2022-11-24 LAB — URINALYSIS, MICROSCOPIC (REFLEX)

## 2022-11-24 LAB — CBC
HCT: 44.3 % (ref 36.0–46.0)
Hemoglobin: 15.3 g/dL — ABNORMAL HIGH (ref 12.0–15.0)
MCH: 29.7 pg (ref 26.0–34.0)
MCHC: 34.5 g/dL (ref 30.0–36.0)
MCV: 86 fL (ref 80.0–100.0)
Platelets: 338 10*3/uL (ref 150–400)
RBC: 5.15 MIL/uL — ABNORMAL HIGH (ref 3.87–5.11)
RDW: 12.5 % (ref 11.5–15.5)
WBC: 11.5 10*3/uL — ABNORMAL HIGH (ref 4.0–10.5)
nRBC: 0 % (ref 0.0–0.2)

## 2022-11-24 LAB — URINALYSIS, ROUTINE W REFLEX MICROSCOPIC
Bilirubin Urine: NEGATIVE
Glucose, UA: >=500 mg/dL — AB
Hgb urine dipstick: NEGATIVE
Ketones, ur: NEGATIVE mg/dL
Leukocytes,Ua: NEGATIVE
Nitrite: NEGATIVE
Protein, ur: NEGATIVE mg/dL
Specific Gravity, Urine: 1.01 (ref 1.005–1.030)
pH: 6 (ref 5.0–8.0)

## 2022-11-24 LAB — LIPASE, BLOOD: Lipase: 53 U/L — ABNORMAL HIGH (ref 11–51)

## 2022-11-24 MED ORDER — KETOROLAC TROMETHAMINE 15 MG/ML IJ SOLN
15.0000 mg | Freq: Once | INTRAMUSCULAR | Status: AC
  Administered 2022-11-24: 15 mg via INTRAVENOUS
  Filled 2022-11-24: qty 1

## 2022-11-24 MED ORDER — SODIUM CHLORIDE 0.9 % IV BOLUS
1000.0000 mL | Freq: Once | INTRAVENOUS | Status: AC
Start: 2022-11-24 — End: 2022-11-24
  Administered 2022-11-24: 1000 mL via INTRAVENOUS

## 2022-11-24 MED ORDER — IOHEXOL 300 MG/ML  SOLN
100.0000 mL | Freq: Once | INTRAMUSCULAR | Status: AC | PRN
  Administered 2022-11-24: 100 mL via INTRAVENOUS

## 2022-11-24 NOTE — ED Provider Notes (Signed)
[facility]GENCY [facility]PT [facility] Emergency Department Provider Note MRN:  [id number]  Arrival date & time: 11/24/22     Chief Complaint   Abdominal Pain and Flank Pain   History of Present Illness   Emily Navarro is a 52 y.o. year-old female with a history of hypertension, diabetes presenting to the ED with chief complaint of abdominal pain.  Pain to the right flank and right lower quadrant, associated with nausea.  No fever, no diarrhea, no constipation, no vaginal bleeding or discharge, no hematuria or dysuria.  Review of Systems  A thorough review of systems was obtained and all systems are negative except as noted in the HPI and PMH.   Patient's Health History    Past Medical History:  Diagnosis Date   Bipolar 1 disorder (HCC)    Diabetes mellitus without complication (HCC)    Hypertension     Past Surgical History:  Procedure Laterality Date   abdominoplasty     HYSTEROSCOPY     tummy tuck      Family History  Problem Relation Age of Onset   Thyroid disease Mother    Diabetes Father    Hypertension Father    Cancer Father    Breast cancer Paternal Grandmother     Social History   Socioeconomic History   Marital status: Married    Spouse name: Not on file   Number of children: Not on file   Years of education: Not on file   Highest education level: Not on file  Occupational History   Not on file  Tobacco Use   Smoking status: Never   Smokeless tobacco: Never  Vaping Use   Vaping status: Never Used  Substance and Sexual Activity   Alcohol use: No    Alcohol/week: 0.0 standard drinks of alcohol   Drug use: No   Sexual activity: Yes    Partners: Male    Birth control/protection: Condom  Other Topics Concern   Not on file  Social History Narrative   Not on file   Social Determinants of Health   Financial Resource Strain: Not on file  Food Insecurity: Not on file  Transportation Needs: Not on file  Physical Activity: Not on file   Stress: Not on file  Social Connections: Not on file  Intimate Partner Violence: Not on file     Physical Exam   Vitals:   11/24/22 0400 11/24/22 0500  BP: 115/77 113/66  Pulse: 75 74  Resp: 18 17  Temp:    SpO2: 99% 99%    CONSTITUTIONAL: Well-appearing, NAD NEURO/PSYCH:  Alert and oriented x 3, no focal deficits EYES:  eyes equal and reactive ENT/NECK:  no LAD, no JVD CARDIO: Regular rate, well-perfused, normal S1 and S2 PULM:  CTAB no wheezing or rhonchi GI/GU:  non-distended, non-tender MSK/SPINE:  No gross deformities, no edema SKIN:  no rash, atraumatic   *Additional and/or pertinent findings included in MDM below  Diagnostic and Interventional Summary    EKG Interpretation Date/Time:    Ventricular Rate:    PR Interval:    QRS Duration:    QT Interval:    QTC Calculation:   R Axis:      Text Interpretation:         Labs Reviewed  CBC - Abnormal; Notable for the following components:      Result Value   WBC 11.5 (*)    RBC 5.15 (*)    Hemoglobin 15.3 (*)    All other  components within normal limits  COMPREHENSIVE METABOLIC PANEL - Abnormal; Notable for the following components:   Sodium 131 (*)    Glucose, Bld 368 (*)    Total Bilirubin 1.2 (*)    All other components within normal limits  LIPASE, BLOOD - Abnormal; Notable for the following components:   Lipase 53 (*)    All other components within normal limits  URINALYSIS, ROUTINE W REFLEX MICROSCOPIC - Abnormal; Notable for the following components:   APPearance HAZY (*)    Glucose, UA >=500 (*)    All other components within normal limits  URINALYSIS, MICROSCOPIC (REFLEX) - Abnormal; Notable for the following components:   Bacteria, UA FEW (*)    All other components within normal limits    CT ABDOMEN PELVIS W CONTRAST  Final Result      Medications  sodium chloride 0.9 % bolus 1,000 mL (0 mLs Intravenous Stopped 11/24/22 0522)  ketorolac (TORADOL) 15 MG/ML injection 15 mg (15 mg  Intravenous Given 11/24/22 0338)  iohexol (OMNIPAQUE) 300 MG/ML solution 100 mL (100 mLs Intravenous Contrast Given 11/24/22 0435)     Procedures  /  Critical Care Procedures  ED Course and Medical Decision Making  Initial Impression and Ddx Differential diagnosis includes kidney stone, pyelonephritis, appendicitis, right-sided diverticulitis  Past medical/surgical history that increases complexity of ED encounter: Diabetes  Interpretation of Diagnostics I personally reviewed the laboratory assessment and my interpretation is as follows: Minimal leukocytosis, pseudohyponatremia  CT is without acute process  Patient Reassessment and Ultimate Disposition/Management     Patient feels well on reassessment, reassured by the negative workup, appropriate for discharge.  Patient management required discussion with the following services or consulting groups:  None  Complexity of Problems Addressed Acute illness or injury that poses threat of life of bodily function  Additional Data Reviewed and Analyzed Further history obtained from: Further history from spouse/family member  Additional Factors Impacting ED Encounter Risk None  Elmer Sow. Pilar Plate, MD [facility] Emergency Medicine [facility] [email or web address]  Final Clinical Impressions(s) / ED Diagnoses     ICD-10-CM   1. Flank pain  R10.9       ED Discharge Orders     None        Discharge Instructions Discussed with and Provided to Patient:    Discharge Instructions      You were evaluated in the Emergency Department and after careful evaluation, we did not find any emergent condition requiring admission or further testing in the hospital.  Your exam/testing today is overall reassuring.  Recommend Tylenol or Motrin at home for pain, follow-up with primary care doctor.  Please return to the Emergency Department if you experience any worsening of your condition.   Thank you for allowing [place] to be  a part of your care.      Sabas Sous, MD 11/24/22 [phone]

## 2022-11-24 NOTE — Discharge Instructions (Signed)
You were evaluated in the Emergency Department and after careful evaluation, we did not find any emergent condition requiring admission or further testing in the hospital.  Your exam/testing today is overall reassuring.  Recommend Tylenol or Motrin at home for pain, follow-up with primary care doctor.  Please return to the Emergency Department if you experience any worsening of your condition.   Thank you for allowing [place] to be a part of your care.

## 2022-11-24 NOTE — ED Triage Notes (Signed)
Patient reports R sided abdominal pain starting yesterday wrapping around the R side of her back as well. Reports nausea, no emesis or diarrhea. Denies urinary symptoms. Feels cold chills. Describes pain as dull and aching.

## 2022-11-25 ENCOUNTER — Ambulatory Visit: Payer: Medicaid Other | Admitting: Obstetrics & Gynecology

## 2022-11-25 ENCOUNTER — Encounter: Payer: Self-pay | Admitting: Obstetrics & Gynecology

## 2022-11-25 ENCOUNTER — Other Ambulatory Visit (HOSPITAL_COMMUNITY)
Admission: RE | Admit: 2022-11-25 | Discharge: 2022-11-25 | Disposition: A | Discharge status: Home / Self Care | Payer: Medicaid Other | Source: Ambulatory Visit | Attending: Obstetrics & Gynecology | Admitting: Obstetrics & Gynecology

## 2022-11-25 VITALS — BP 108/72 | HR 71 | Ht 65.0 in | Wt 170.0 lb

## 2022-11-25 DIAGNOSIS — N762 Acute vulvitis: Secondary | ICD-10-CM

## 2022-11-25 LAB — POCT URINALYSIS DIPSTICK
Bilirubin, UA: NEGATIVE
Blood, UA: NEGATIVE
Glucose, UA: POSITIVE — AB
Ketones, UA: NEGATIVE
Leukocytes, UA: NEGATIVE
Nitrite, UA: NEGATIVE
Protein, UA: NEGATIVE
Spec Grav, UA: 1.015 (ref 1.010–1.025)
Urobilinogen, UA: 0.2 U/dL
pH, UA: 6 (ref 5.0–8.0)

## 2022-11-25 NOTE — Progress Notes (Signed)
52 y.o. GYN presents for vaginal pain and discharge.

## 2022-11-25 NOTE — Progress Notes (Signed)
Patient ID: Emily Navarro, female   DOB: [birth year], 52 y.o.   MRN: [id number]  Chief Complaint  Patient presents with   Vaginitis   Vaginal Discharge   Vaginal Pain    HPI Emily Navarro is a 52 y.o. female.  [id number] Patient's last menstrual period was 10/10/2022 (exact date). Menses are irregular and she has VMS. For several days she has had vaginal discomfort and vaginal discharge, no dysuria. HPI  Past Medical History:  Diagnosis Date   Bipolar 1 disorder (HCC)    Diabetes mellitus without complication (HCC)    Hypertension     Past Surgical History:  Procedure Laterality Date   abdominoplasty     HYSTEROSCOPY     tummy tuck      Family History  Problem Relation Age of Onset   Thyroid disease Mother    Diabetes Father    Hypertension Father    Cancer Father    Breast cancer Paternal Grandmother     Social History Social History   Tobacco Use   Smoking status: Never   Smokeless tobacco: Never  Vaping Use   Vaping status: Never Used  Substance Use Topics   Alcohol use: No    Alcohol/week: 0.0 standard drinks of alcohol   Drug use: No    Allergies  Allergen Reactions   Latex Rash    Current Outpatient Medications  Medication Sig Dispense Refill   Semaglutide, 1 MG/DOSE, (OZEMPIC, 1 MG/DOSE,) 2 MG/1.5ML SOPN Inject 1 mg into the skin once a week.     Boric Acid Vaginal (AZO BORIC ACID) 600 MG SUPP Place 1 suppository vaginally at bedtime. For 14 days. 30 suppository 2   canagliflozin (INVOKANA) 100 MG TABS tablet Take by mouth daily before breakfast.     clindamycin (CLEOCIN) 150 MG capsule Take 1 capsule (150 mg total) by mouth 3 (three) times daily. (Patient not taking: Reported on 08/14/2020) 21 capsule 2   clonazePAM (KLONOPIN) 1 MG tablet Take 1 mg by mouth daily.     clotrimazole (LOTRIMIN) 1 % cream APPLY TO AFFECTED AREA TWICE A DAY 30 g 0   clotrimazole-betamethasone (LOTRISONE) cream APPLY TO AFFECTED AREA TOPICALLY TWICE A DAY 45 g 0    fluconazole (DIFLUCAN) 200 MG tablet TAKE 1 TABLET BY MOUTH EVERY 3 DAYS 3 tablet 2   glucose blood test strip Use as instructed . Patient uses Sure to go meter 100 each 12   HYDROcodone-acetaminophen (NORCO/VICODIN) 5-325 MG tablet Take 1 tablet by mouth every 6 (six) hours as needed for severe pain. 6 tablet 0   ibuprofen (ADVIL,MOTRIN) 800 MG tablet Take 800 mg by mouth every 8 (eight) hours as needed.     lisinopril (PRINIVIL,ZESTRIL) 5 MG tablet Take 5 mg by mouth daily.     metFORMIN (GLUCOPHAGE-XR) 500 MG 24 hr tablet TAKE 4 TABLETS (2,000 MG TOTAL) BY MOUTH DAILY WITH SUPPER. 120 tablet 11   PARoxetine (PAXIL) 20 MG tablet TAKE 1 TABLET BY MOUTH EVERY DAY 90 tablet 4   terconazole (TERAZOL 3) 0.8 % vaginal cream Place 1 applicator vaginally at bedtime. (Patient not taking: Reported on 08/14/2020) 20 g 0   triamterene-hydrochlorothiazide (MAXZIDE-25) 37.5-25 MG per tablet Take 1 tablet by mouth daily. 30 tablet 11   [facility] THIN LANCETS MISC 60 Sticks by Does not apply route 2 (two) times daily. Patient needs lancets to go with sure to go lancet device. If patient does not have you may fill lancet device and lancets generic brand.  Patient to check CBG bid. 100 each 0   zolpidem (AMBIEN) 5 MG tablet Take 10 mg by mouth at bedtime as needed for sleep (1/2 tablet).     No current facility-administered medications for this visit.    Review of Systems Review of Systems  Constitutional: Negative.   Respiratory: Negative.    Cardiovascular: Negative.   Gastrointestinal: Negative.   Genitourinary:  Positive for vaginal discharge and vaginal pain. Negative for dysuria, menstrual problem and vaginal bleeding.    Blood pressure 108/72, pulse 71, height 5\' 5"  (1.651 m), weight 170 lb (77.1 kg), last menstrual period 10/10/2022.  Physical Exam Physical Exam Vitals and nursing note reviewed. Exam conducted with a chaperone present (mild vulvar erythema).  Constitutional:      Appearance:  Normal appearance.  Genitourinary:    Exam position: Lithotomy position.     Vagina: Vaginal discharge present.     Cervix: Normal.     Uterus: Normal.      Adnexa: Right adnexa normal and left adnexa normal.  Skin:    General: Skin is warm and dry.  Neurological:     Mental Status: She is alert.  Psychiatric:        Mood and Affect: Mood normal.        Behavior: Behavior normal.     Data Reviewed   Assessment Acute vulvitis - Plan: Cervicovaginal ancillary only( Emily Navarro), POCT Urinalysis Dipstick, Urine Culture   Plan Orders Placed This Encounter  Procedures   Urine Culture   POCT Urinalysis Dipstick   Notify if her sx do not improve    Emily Navarro 11/25/2022, 3:03 PM

## 2022-11-26 LAB — CERVICOVAGINAL ANCILLARY ONLY
Bacterial Vaginitis (gardnerella): NEGATIVE
Candida Glabrata: POSITIVE — AB
Candida Vaginitis: NEGATIVE
Chlamydia: NEGATIVE
Comment: NEGATIVE
Comment: NEGATIVE
Comment: NEGATIVE
Comment: NEGATIVE
Comment: NEGATIVE
Comment: NORMAL
Neisseria Gonorrhea: NEGATIVE
Trichomonas: NEGATIVE

## 2022-11-26 MED ORDER — FLUCONAZOLE 150 MG PO TABS: 150.0000 mg | ORAL_TABLET | Freq: Once | ORAL | 0 refills | Status: AC

## 2022-11-26 NOTE — Addendum Note (Signed)
Addended by: Adam Phenix on: 11/26/2022 01:31 PM   Modules accepted: Orders

## 2022-11-27 LAB — URINE CULTURE

## 2022-12-12 ENCOUNTER — Other Ambulatory Visit: Payer: Self-pay | Admitting: *Deleted

## 2022-12-12 DIAGNOSIS — B3731 Acute candidiasis of vulva and vagina: Secondary | ICD-10-CM

## 2022-12-12 MED ORDER — FLUCONAZOLE 200 MG PO TABS: 200.0000 mg | ORAL_TABLET | ORAL | 2 refills | Status: DC

## 2022-12-12 NOTE — Progress Notes (Signed)
Pt called to office requesting a 3 day treatment for yeast as that is her normal regimen.  Per Dr Jolayne Panther - ok to send today.

## 2023-01-15 ENCOUNTER — Ambulatory Visit (INDEPENDENT_AMBULATORY_CARE_PROVIDER_SITE_OTHER): Payer: Medicaid Other | Admitting: Obstetrics and Gynecology

## 2023-01-15 DIAGNOSIS — Z91199 Patient's noncompliance with other medical treatment and regimen due to unspecified reason: Secondary | ICD-10-CM

## 2023-01-15 NOTE — Progress Notes (Deleted)
   ANNUAL EXAM Patient name: Emily Navarro MRN [id number]  Date of birth: [birth year] Chief Complaint:   No chief complaint on file.  History of Present Illness:   Emily Navarro is a 53 y.o. [phone] with No LMP recorded. being seen today for a routine annual exam.  Current complaints: ***   The pregnancy intention screening data noted above was reviewed. Potential methods of contraception were discussed. The patient elected to proceed with No data recorded.   Last pap 03/05/21. Results were: NILM w/ HRHPV negative. H/O abnormal pap: yes ASCUS/HPV neg 2014 Last mammogram: 11/28/19. Results were: normal. Family h/o breast cancer: {yes***/no:23838} Last colonoscopy: [facility] 11/2019. Results were: normal. Family h/o colorectal cancer: {yes***/no:23838}     03/05/2021    1:35 PM  Depression screen PHQ 2/9  Decreased Interest 1  Down, Depressed, Hopeless 1  PHQ - 2 Score 2  Altered sleeping 0  Tired, decreased energy 1  Change in appetite 0  Feeling bad or failure about yourself  0  Trouble concentrating 0  Moving slowly or fidgety/restless 0  Suicidal thoughts 0  PHQ-9 Score 3  Difficult doing work/chores Not difficult at all         No data to display           Review of Systems:   Pertinent items are noted in HPI Denies any headaches, blurred vision, fatigue, shortness of breath, chest pain, abdominal pain, abnormal vaginal discharge/itching/odor/irritation, problems with periods, bowel movements, urination, or intercourse unless otherwise stated above. Pertinent History Reviewed:  Reviewed past medical,surgical, social and family history.  Reviewed problem list, medications and allergies. Physical Assessment:  There were no vitals filed for this visit.There is no height or weight on file to calculate BMI.        Physical Examination:   General appearance - well appearing, and in no distress  Mental status - alert, oriented to person, place, and time  Chest -  respiratory effort normal  Heart - normal peripheral perfusion  Breasts - breasts appear normal, no suspicious masses, no skin or nipple changes or axillary nodes  Abdomen - soft, nontender, nondistended, no masses or organomegaly  Pelvic - VULVA: normal appearing vulva with no masses, tenderness or lesions  VAGINA: normal appearing vagina with normal color and discharge, no lesions  CERVIX: normal appearing cervix without discharge or lesions, no CMT  Thin prep pap is {Desc; done/not:10129} *** HR HPV cotesting  UTERUS: uterus is felt to be normal size, shape, consistency and nontender   ADNEXA: No adnexal masses or tenderness noted.  Chaperone present for exam  No results found for this or any previous visit (from the past 24 hours).  Assessment & Plan:  1) Well-Woman Exam Mammogram: schedule screening mammo as soon as possible Colonoscopy: schedule screening colonoscopy as soon as possible*** Pap: Collected today Gardasil: n/a GC/CT: HIV/HCV: A1c ordered [ ]  PCP?  Labs/procedures today:   No orders of the defined types were placed in this encounter.  Meds: No orders of the defined types were placed in this encounter.   Follow-up: No follow-ups on file.  Kieth JAYSON Carolin, MD 01/15/2023 1:12 PM

## 2023-01-16 NOTE — Progress Notes (Signed)
   Complete physical exam  Patient: Emily Navarro   DOB: 10/26/1999   53 y.o. Female  MRN: [id number]  Subjective:    No chief complaint on file.   Emily Navarro is a 53 y.o. female who presents today for a complete physical exam. She reports consuming a {diet types:17450} diet. {types:19826} She generally feels {DESC; WELL/FAIRLY WELL/POORLY:18703}. She reports sleeping {DESC; WELL/FAIRLY WELL/POORLY:18703}. She {does/does not:200015} have additional problems to discuss today.    Most recent fall risk assessment:    07/03/2021   10:42 AM  Fall Risk   Falls in the past year? 0  Number falls in past yr: 0  Injury with Fall? 0  Risk for fall due to : No Fall Risks  Follow up Falls evaluation completed     Most recent depression screenings:    07/03/2021   10:42 AM 05/24/2020   10:46 AM  PHQ 2/9 Scores  PHQ - 2 Score 0 0  PHQ- 9 Score 5     {VISON DENTAL STD PSA (Optional):27386}  {History (Optional):23778}  Patient Care Team: Christen Butter, NP as PCP - General (Nurse Practitioner)   Outpatient Medications Prior to Visit  Medication Sig   fluticasone (FLONASE) 50 MCG/ACT nasal spray Place 2 sprays into both nostrils in the morning and at bedtime. After 7 days, reduce to once daily.   norgestimate-ethinyl estradiol (SPRINTEC 28) 0.25-35 MG-MCG tablet Take 1 tablet by mouth daily.   Nystatin POWD Apply liberally to affected area 2 times per day   spironolactone (ALDACTONE) 100 MG tablet Take 1 tablet (100 mg total) by mouth daily.   No facility-administered medications prior to visit.    ROS        Objective:     There were no vitals taken for this visit. {Vitals History (Optional):23777}  Physical Exam   No results found for any visits on 08/08/21. {Show previous labs (optional):23779}    Assessment & Plan:    Routine Health Maintenance and Physical Exam  Immunization History  Administered Date(s) Administered   DTaP 01/09/1999, 03/07/1999,  05/16/1999, 01/30/2000, 08/15/2003   Hepatitis A 06/11/2007, 06/16/2008   Hepatitis B 10/27/1998, 12/04/1998, 05/16/1999   HiB (PRP-OMP) 01/09/1999, 03/07/1999, 05/16/1999, 01/30/2000   IPV 01/09/1999, 03/07/1999, 11/04/1999, 08/15/2003   Influenza,inj,Quad PF,6+ Mos 09/16/2013   Influenza-Unspecified 12/17/2011   MMR 11/03/2000, 08/15/2003   Meningococcal Polysaccharide 06/16/2011   Pneumococcal Conjugate-13 01/30/2000   Pneumococcal-Unspecified 05/16/1999, 07/30/1999   Tdap 06/16/2011   Varicella 11/04/1999, 06/11/2007    Health Maintenance  Topic Date Due   HIV Screening  Never done   Hepatitis C Screening  Never done   INFLUENZA VACCINE  08/06/2021   PAP-Cervical Cytology Screening  08/08/2021 (Originally 10/26/2019)   PAP SMEAR-Modifier  08/08/2021 (Originally 10/26/2019)   TETANUS/TDAP  08/08/2021 (Originally 06/15/2021)   HPV VACCINES  Discontinued   COVID-19 Vaccine  Discontinued    Discussed health benefits of physical activity, and encouraged her to engage in regular exercise appropriate for her age and condition.  Problem List Items Addressed This Visit   None Visit Diagnoses     Annual physical exam    -  Primary   Cervical cancer screening       Need for Tdap vaccination          No follow-ups on file.     Christen Butter, NP

## 2023-02-26 ENCOUNTER — Other Ambulatory Visit: Payer: Self-pay

## 2023-02-26 ENCOUNTER — Encounter (HOSPITAL_BASED_OUTPATIENT_CLINIC_OR_DEPARTMENT_OTHER): Payer: Self-pay

## 2023-02-26 ENCOUNTER — Emergency Department (HOSPITAL_BASED_OUTPATIENT_CLINIC_OR_DEPARTMENT_OTHER)
Admission: EM | Admit: 2023-02-26 | Discharge: 2023-02-26 | Disposition: A | Discharge status: Home / Self Care | Payer: Medicaid Other | Attending: Emergency Medicine | Admitting: Emergency Medicine

## 2023-02-26 DIAGNOSIS — K0889 Other specified disorders of teeth and supporting structures: Secondary | ICD-10-CM | POA: Insufficient documentation

## 2023-02-26 DIAGNOSIS — Z91040 Latex allergy status: Secondary | ICD-10-CM | POA: Diagnosis not present

## 2023-02-26 MED ORDER — AMOXICILLIN 500 MG PO CAPS
1000.0000 mg | ORAL_CAPSULE | Freq: Once | ORAL | Status: AC
  Administered 2023-02-26: 1000 mg via ORAL
  Filled 2023-02-26: qty 2

## 2023-02-26 MED ORDER — AMOXICILLIN 500 MG PO CAPS: 1000.0000 mg | ORAL_CAPSULE | Freq: Two times a day (BID) | ORAL | 0 refills | Status: DC

## 2023-02-26 NOTE — Discharge Instructions (Signed)
Take Amoxicillin as prescribed. Continue ibuprofen 800 mg every 8 hours and add Tylenol (up to 4000 mg in a 24 hour period) for pain.  Continue efforts to follow up with your dentist.

## 2023-02-26 NOTE — ED Triage Notes (Addendum)
In for eval of right lower dental pain. Received filling last week at the dentist. Last dose ibuprofen at 1500 today.

## 2023-02-26 NOTE — ED Provider Notes (Signed)
 [place] EM[facility]Y DEPARTMENT AT [facility] Provider Note   CSN: [id number] Arrival date & time: 02/26/23  1616     History  Chief Complaint  Patient presents with   Dental Pain    Emily Navarro is a 53 y.o. female.  Patient with severe lower molar dental pain that started after she saw her dentist for a filling. She states she got up the day after and cold air hit the tooth with severe pain that has been constant since. No facial swelling or fever.   The history is provided by the patient. No language interpreter was used.  Dental Pain      Home Medications Prior to Admission medications   Medication Sig Start Date End Date Taking? Authorizing Provider  amoxicillin (AMOXIL) 500 MG capsule Take 2 capsules (1,000 mg total) by mouth 2 (two) times daily. 02/26/23  Yes Johnie Makki, Melvenia Beam, PA-C  Boric Acid Vaginal (AZO BORIC ACID) 600 MG SUPP Place 1 suppository vaginally at bedtime. For 14 days. 05/11/21   Brock Bad, MD  canagliflozin (INVOKANA) 100 MG TABS tablet Take by mouth daily before breakfast.    [provider]  clindamycin (CLEOCIN) 150 MG capsule Take 1 capsule (150 mg total) by mouth 3 (three) times daily. Patient not taking: Reported on 08/14/2020 02/01/20   Brock Bad, MD  clonazePAM (KLONOPIN) 1 MG tablet Take 1 mg by mouth daily.    [provider]  clotrimazole (LOTRIMIN) 1 % cream APPLY TO AFFECTED AREA TWICE A DAY 05/29/21   Brock Bad, MD  clotrimazole-betamethasone (LOTRISONE) cream APPLY TO AFFECTED AREA TOPICALLY TWICE A DAY 05/29/21   Brock Bad, MD  fluconazole (DIFLUCAN) 200 MG tablet Take 1 tablet (200 mg total) by mouth every 3 (three) days. 12/12/22   Constant, Peggy, MD  glucose blood test strip Use as instructed . Patient uses Sure to go meter 12/19/13   Brock Bad, MD  HYDROcodone-acetaminophen (NORCO/VICODIN) 5-325 MG tablet Take 1 tablet by mouth every 6 (six) hours as needed for severe  pain. 07/23/19   Gwyneth Sprout, MD  ibuprofen (ADVIL,MOTRIN) 800 MG tablet Take 800 mg by mouth every 8 (eight) hours as needed.    [provider]  lisinopril (PRINIVIL,ZESTRIL) 5 MG tablet Take 5 mg by mouth daily.    [provider]  metFORMIN (GLUCOPHAGE-XR) 500 MG 24 hr tablet TAKE 4 TABLETS (2,000 MG TOTAL) BY MOUTH DAILY WITH SUPPER. 04/07/18   Brock Bad, MD  PARoxetine (PAXIL) 20 MG tablet TAKE 1 TABLET BY MOUTH EVERY DAY 03/28/21   Brock Bad, MD  Semaglutide, 1 MG/DOSE, (OZEMPIC, 1 MG/DOSE,) 2 MG/1.5ML SOPN Inject 1 mg into the skin once a week.    [provider]  terconazole (TERAZOL 3) 0.8 % vaginal cream Place 1 applicator vaginally at bedtime. Patient not taking: Reported on 08/14/2020 08/16/19   Brock Bad, MD  triamterene-hydrochlorothiazide (MAXZIDE-25) 37.5-25 MG per tablet Take 1 tablet by mouth daily. 12/13/13   Brock Bad, MD  [facility] THIN LANCETS MISC 60 Sticks by Does not apply route 2 (two) times daily. Patient needs lancets to go with sure to go lancet device. If patient does not have you may fill lancet device and lancets generic brand. Patient to check CBG bid. 12/19/13   Brock Bad, MD  zolpidem (AMBIEN) 5 MG tablet Take 10 mg by mouth at bedtime as needed for sleep (1/2 tablet).    [provider]  Allergies    Latex    Review of Systems   Review of Systems  Physical Exam Updated Vital Signs BP 110/79 (BP Location: Right Arm)   Pulse 75   Temp 98.3 F (36.8 C)   Resp 16   Ht 5\' 5"  (1.651 m)   Wt 74.8 kg   LMP 01/09/2023 (Approximate)   SpO2 98%   BMI 27.46 kg/m  Physical Exam Constitutional:      General: She is not in acute distress.    Appearance: She is well-developed. She is not ill-appearing.  HENT:     Mouth/Throat:     Comments: Generally good dentition. No intraoral swelling. Right lower 1st molar appears whole.  Pulmonary:     Effort: Pulmonary effort is normal.   Musculoskeletal:        General: Normal range of motion.     Cervical back: Normal range of motion.  Skin:    General: Skin is warm and dry.  Neurological:     Mental Status: She is alert and oriented to person, place, and time.     ED Results / Procedures / Treatments   Labs (all labs ordered are listed, but only abnormal results are displayed) Labs Reviewed - No data to display  EKG None  Radiology No results found.  Procedures Procedures    Medications Ordered in ED Medications  amoxicillin (AMOXIL) capsule 1,000 mg (1,000 mg Oral Given 02/26/23 1804)    ED Course/ Medical Decision Making/ A&P Clinical Course as of 02/28/23 1504  Sat Feb 28, 2023  1503 Dental pain after filling placed. No evidence fractured tooth or loose filling. Will provide abx for coverage apical abscess and encourage her to continue Tylenol and ibuprofen at home.  [SU]    Clinical Course User Index [SU] Elpidio Anis, PA-C                                 Medical Decision Making Risk Prescription drug management.           Final Clinical Impression(s) / ED Diagnoses Final diagnoses:  Pain, dental    Rx / DC Orders ED Discharge Orders          Ordered    amoxicillin (AMOXIL) 500 MG capsule  2 times daily        02/26/23 1745              Elpidio Anis, PA-C 02/28/23 1504    Glyn Ade, MD 03/02/23 1455

## 2023-03-03 ENCOUNTER — Emergency Department (HOSPITAL_BASED_OUTPATIENT_CLINIC_OR_DEPARTMENT_OTHER)
Admission: EM | Admit: 2023-03-03 | Discharge: 2023-03-03 | Discharge status: Against Medical Advice | Payer: Medicaid Other | Attending: Emergency Medicine | Admitting: Emergency Medicine

## 2023-03-03 ENCOUNTER — Other Ambulatory Visit: Payer: Self-pay

## 2023-03-03 DIAGNOSIS — Z5321 Procedure and treatment not carried out due to patient leaving prior to being seen by health care provider: Secondary | ICD-10-CM | POA: Insufficient documentation

## 2023-03-03 DIAGNOSIS — R739 Hyperglycemia, unspecified: Secondary | ICD-10-CM | POA: Insufficient documentation

## 2023-03-03 LAB — CBG MONITORING, ED: Glucose-Capillary: 149 mg/dL — ABNORMAL HIGH (ref 70–99)

## 2023-03-03 NOTE — ED Notes (Signed)
 Patient reports she does not want to be seen after FSBS wnl while in triage. States "I feel so dumb. I just wanted to make sure my sugar was fine".   Patient encouraged to stay for eval, however patient refused

## 2023-03-03 NOTE — ED Triage Notes (Addendum)
 Patient presents with hyperglycemia. Reports blood sugar reading of "525" pta. Also states "feel a little loopy". Denies any other complaints  FSBS 149mg /dl in triage

## 2023-03-27 ENCOUNTER — Other Ambulatory Visit: Payer: Self-pay | Admitting: Obstetrics and Gynecology

## 2023-03-27 DIAGNOSIS — B3731 Acute candidiasis of vulva and vagina: Secondary | ICD-10-CM

## 2023-04-02 ENCOUNTER — Other Ambulatory Visit: Payer: Self-pay

## 2023-04-02 DIAGNOSIS — B3731 Acute candidiasis of vulva and vagina: Secondary | ICD-10-CM

## 2023-04-02 MED ORDER — FLUCONAZOLE 200 MG PO TABS: 200.0000 mg | ORAL_TABLET | ORAL | 0 refills | Status: AC

## 2023-04-02 NOTE — Progress Notes (Signed)
 RX diflucan sent in, pt usual regimen is 1 tablet every three days with a total of 3 tablets. Sent to pharmacy for yeast symptoms

## 2023-04-30 ENCOUNTER — Ambulatory Visit: Admitting: Obstetrics and Gynecology

## 2023-05-25 ENCOUNTER — Ambulatory Visit: Admitting: Obstetrics and Gynecology

## 2023-06-09 ENCOUNTER — Ambulatory Visit: Admitting: Obstetrics and Gynecology

## 2023-06-23 ENCOUNTER — Ambulatory Visit
Admission: RE | Admit: 2023-06-23 | Discharge: 2023-06-23 | Disposition: A | Discharge status: Home / Self Care | Source: Ambulatory Visit | Attending: Obstetrics | Admitting: Obstetrics

## 2023-06-23 ENCOUNTER — Other Ambulatory Visit: Payer: Self-pay | Admitting: Obstetrics

## 2023-06-23 DIAGNOSIS — Z1231 Encounter for screening mammogram for malignant neoplasm of breast: Secondary | ICD-10-CM

## 2023-06-25 ENCOUNTER — Other Ambulatory Visit: Payer: Self-pay | Admitting: Obstetrics

## 2023-06-25 DIAGNOSIS — R928 Other abnormal and inconclusive findings on diagnostic imaging of breast: Secondary | ICD-10-CM

## 2023-07-01 ENCOUNTER — Ambulatory Visit

## 2023-07-01 ENCOUNTER — Ambulatory Visit
Admission: RE | Admit: 2023-07-01 | Discharge: 2023-07-01 | Disposition: A | Discharge status: Home / Self Care | Source: Ambulatory Visit | Attending: Obstetrics | Admitting: Obstetrics

## 2023-07-01 ENCOUNTER — Ambulatory Visit: Payer: Self-pay | Admitting: Family Medicine

## 2023-07-01 ENCOUNTER — Other Ambulatory Visit: Payer: Self-pay | Admitting: Family Medicine

## 2023-07-01 DIAGNOSIS — R928 Other abnormal and inconclusive findings on diagnostic imaging of breast: Secondary | ICD-10-CM

## 2023-10-16 ENCOUNTER — Other Ambulatory Visit: Payer: Self-pay | Admitting: Obstetrics and Gynecology

## 2023-10-16 DIAGNOSIS — B3731 Acute candidiasis of vulva and vagina: Secondary | ICD-10-CM

## 2023-12-30 ENCOUNTER — Other Ambulatory Visit (HOSPITAL_BASED_OUTPATIENT_CLINIC_OR_DEPARTMENT_OTHER): Payer: Self-pay

## 2023-12-30 ENCOUNTER — Emergency Department (HOSPITAL_BASED_OUTPATIENT_CLINIC_OR_DEPARTMENT_OTHER)
Admission: EM | Admit: 2023-12-30 | Discharge: 2023-12-30 | Disposition: A | Discharge status: Home / Self Care | Attending: Emergency Medicine | Admitting: Emergency Medicine

## 2023-12-30 ENCOUNTER — Other Ambulatory Visit: Payer: Self-pay

## 2023-12-30 ENCOUNTER — Encounter (HOSPITAL_BASED_OUTPATIENT_CLINIC_OR_DEPARTMENT_OTHER): Payer: Self-pay | Admitting: Emergency Medicine

## 2023-12-30 DIAGNOSIS — Z91040 Latex allergy status: Secondary | ICD-10-CM | POA: Diagnosis not present

## 2023-12-30 DIAGNOSIS — E119 Type 2 diabetes mellitus without complications: Secondary | ICD-10-CM | POA: Diagnosis not present

## 2023-12-30 DIAGNOSIS — K0889 Other specified disorders of teeth and supporting structures: Secondary | ICD-10-CM | POA: Diagnosis present

## 2023-12-30 DIAGNOSIS — K029 Dental caries, unspecified: Secondary | ICD-10-CM | POA: Insufficient documentation

## 2023-12-30 DIAGNOSIS — Z7984 Long term (current) use of oral hypoglycemic drugs: Secondary | ICD-10-CM | POA: Insufficient documentation

## 2023-12-30 DIAGNOSIS — Z79899 Other long term (current) drug therapy: Secondary | ICD-10-CM | POA: Insufficient documentation

## 2023-12-30 DIAGNOSIS — B379 Candidiasis, unspecified: Secondary | ICD-10-CM

## 2023-12-30 DIAGNOSIS — I10 Essential (primary) hypertension: Secondary | ICD-10-CM | POA: Diagnosis not present

## 2023-12-30 MED ORDER — OXYCODONE HCL 5 MG PO TABS
5.0000 mg | ORAL_TABLET | Freq: Four times a day (QID) | ORAL | 0 refills | Status: AC | PRN
  Filled 2023-12-30: qty 5, 2d supply, fill #0

## 2023-12-30 MED ORDER — AMOXICILLIN 500 MG PO CAPS
1000.0000 mg | ORAL_CAPSULE | Freq: Two times a day (BID) | ORAL | 0 refills | Status: AC
  Filled 2023-12-30: qty 40, 10d supply, fill #0

## 2023-12-30 MED ORDER — IBUPROFEN 800 MG PO TABS
800.0000 mg | ORAL_TABLET | Freq: Three times a day (TID) | ORAL | 0 refills | Status: AC | PRN
  Filled 2023-12-30: qty 20, 7d supply, fill #0

## 2023-12-30 NOTE — Discharge Instructions (Addendum)
 Take antibiotic as prescribed.  Recommend 1000 mg of Tylenol  every 6 hours as needed as well as ibuprofen  or oxycodone  that I have prescribed you.  Roxicodone  is a narcotic pain medicine that is sedating so please be careful with its use.  Do not mix alcohol drugs or changes activities including driving.  Follow-up with dentist.  Return if worsens.

## 2023-12-30 NOTE — ED Notes (Signed)

## 2023-12-30 NOTE — ED Triage Notes (Signed)
 PT reports RT lower dental pain starting yesterday morning. Unable to get dental appt. Took ibuprofen  1 hr pta

## 2023-12-30 NOTE — ED Provider Notes (Signed)
 " [place] [facility] Provider Note   CSN: [id number] Arrival date & time: 12/30/23  9296     Patient presents with: Dental Pain   Emily Navarro is a 53 y.o. female.   Patient here right lower dental pain since yesterday morning.  Denies any weakness numbness tingling.  Denies any major difficulty open mouth.  No swelling underneath jaw or the face.  Denies any fever or chills.  History of hypertension bipolar diabetes.  The history is provided by the patient.       Prior to Admission medications  Medication Sig Start Date End Date Taking? Authorizing Provider  amoxicillin  (AMOXIL ) 500 MG capsule Take 2 capsules (1,000 mg total) by mouth 2 (two) times daily for 10 days. 12/30/23 01/09/24  Ainslee Sou, DO  Boric Acid Vaginal (AZO BORIC ACID) 600 MG SUPP Place 1 suppository vaginally at bedtime. For 14 days. 05/11/21   Rudy Carlin LABOR, MD  canagliflozin (INVOKANA) 100 MG TABS tablet Take by mouth daily before breakfast.    [provider]  clindamycin  (CLEOCIN ) 150 MG capsule Take 1 capsule (150 mg total) by mouth 3 (three) times daily. Patient not taking: Reported on 08/14/2020 02/01/20   Rudy Carlin LABOR, MD  clonazePAM (KLONOPIN) 1 MG tablet Take 1 mg by mouth daily.    [provider]  clotrimazole  (LOTRIMIN ) 1 % cream APPLY TO AFFECTED AREA TWICE A DAY 05/29/21   Rudy Carlin LABOR, MD  clotrimazole -betamethasone  (LOTRISONE ) cream APPLY TO AFFECTED AREA TOPICALLY TWICE A DAY 05/29/21   Rudy Carlin LABOR, MD  fluconazole  (DIFLUCAN ) 200 MG tablet Take 1 tablet (200 mg total) by mouth every 3 (three) days. 04/02/23   Zina Jerilynn LABOR, MD  glucose blood test strip Use as instructed . Patient uses Sure to go meter 12/19/13   Rudy Carlin LABOR, MD  HYDROcodone -acetaminophen  (NORCO/VICODIN) 5-325 MG tablet Take 1 tablet by mouth every 6 (six) hours as needed for severe pain. 07/23/19   Doretha Folks, MD  ibuprofen  (ADVIL ) 800 MG  tablet Take 1 tablet (800 mg total) by mouth every 8 (eight) hours as needed for up to 20 doses. 12/30/23  Yes Wilma Wuthrich, DO  ibuprofen  (ADVIL ,MOTRIN ) 800 MG tablet Take 800 mg by mouth every 8 (eight) hours as needed.    [provider]  lisinopril (PRINIVIL,ZESTRIL) 5 MG tablet Take 5 mg by mouth daily.    [provider]  metFORMIN  (GLUCOPHAGE -XR) 500 MG 24 hr tablet TAKE 4 TABLETS (2,000 MG TOTAL) BY MOUTH DAILY WITH SUPPER. 04/07/18   Rudy Carlin LABOR, MD  oxyCODONE  (ROXICODONE ) 5 MG immediate release tablet Take 1 tablet (5 mg total) by mouth every 6 (six) hours as needed for up to 5 doses. 12/30/23  Yes Terrilyn Tyner, DO  PARoxetine  (PAXIL ) 20 MG tablet TAKE 1 TABLET BY MOUTH EVERY DAY 03/28/21   Rudy Carlin LABOR, MD  Semaglutide, 1 MG/DOSE, (OZEMPIC, 1 MG/DOSE,) 2 MG/1.5ML SOPN Inject 1 mg into the skin once a week.    [provider]  terconazole  (TERAZOL 3 ) 0.8 % vaginal cream Place 1 applicator vaginally at bedtime. Patient not taking: Reported on 08/14/2020 08/16/19   Rudy Carlin LABOR, MD  triamterene -hydrochlorothiazide  (MAXZIDE-25) 37.5-25 MG per tablet Take 1 tablet by mouth daily. 12/13/13   Rudy Carlin LABOR, MD  [facility] THIN LANCETS MISC 60 Sticks by Does not apply route 2 (two) times daily. Patient needs lancets to go with sure to go lancet device. If patient does  not have you may fill lancet device and lancets generic brand. Patient to check CBG bid. 12/19/13   Rudy Carlin LABOR, MD  zolpidem (AMBIEN) 5 MG tablet Take 10 mg by mouth at bedtime as needed for sleep (1/2 tablet).    [provider]    Allergies: Latex    Review of Systems  Updated Vital Signs BP (!) 113/94   Pulse 73   Temp 97.8 F (36.6 C)   Resp 18   Wt 74.8 kg   LMP 12/16/2023   SpO2 98%   BMI 27.46 kg/m   Physical Exam Vitals and nursing note reviewed.  Constitutional:      General: She is not in acute distress.    Appearance: She is well-developed.   HENT:     Head: Normocephalic and atraumatic.     Comments: Dental caries in the right lower jaw but there is no obvious abscess submandibular swelling facial swelling trismus or drooling Eyes:     Conjunctiva/sclera: Conjunctivae normal.  Cardiovascular:     Rate and Rhythm: Normal rate and regular rhythm.     Heart sounds: No murmur heard. Pulmonary:     Effort: Pulmonary effort is normal. No respiratory distress.     Breath sounds: Normal breath sounds.  Abdominal:     Palpations: Abdomen is soft.     Tenderness: There is no abdominal tenderness.  Musculoskeletal:        General: No swelling.     Cervical back: Neck supple.  Skin:    General: Skin is warm and dry.     Capillary Refill: Capillary refill takes less than 2 seconds.  Neurological:     Mental Status: She is alert.  Psychiatric:        Mood and Affect: Mood normal.     (all labs ordered are listed, but only abnormal results are displayed) Labs Reviewed - No data to display  EKG: None  Radiology: No results found.   Procedures   Medications Ordered in the ED - No data to display                                  Medical Decision Making Risk Prescription drug management.   Emily Navarro is here with right lower dental pain.  There is no concerning features on exam.  There is no facial swelling submandibular swelling trismus or drooling.  There is no major obvious abscess.  Dental caries throughout on the right lower jaw.  Will prescribe antibiotics pain medicine.  Will refer her to dentistry.  Understands return precautions.  Overall I suspect mild dental infection.  But suspect nerve root possibly involved.  Still in significant pain despite pain medicine at home.  Will prescribe prescription for narcotic pain medicine educated about this use.  Discharge.  This chart was dictated using voice recognition software.  Despite best efforts to proofread,  errors can occur which can change the  documentation meaning.      Final diagnoses:  Pain, dental    ED Discharge Orders          Ordered    oxyCODONE  (ROXICODONE ) 5 MG immediate release tablet  Every 6 hours PRN        12/30/23 0733    ibuprofen  (ADVIL ) 800 MG tablet  Every 8 hours PRN        12/30/23 0733    amoxicillin  (AMOXIL ) 500 MG capsule  2 times daily        12/30/23 0733               Ruthe Cornet, DO 12/30/23 9263  "
